# Patient Record
Sex: Female | Born: 1965 | Race: White | Hispanic: No | Marital: Married | Smoking: Current every day smoker
Health system: Southern US, Community
[De-identification: ages and names within clinical notes are randomized; demographics above are authoritative.]

## PROBLEM LIST (undated history)

## (undated) DIAGNOSIS — E785 Hyperlipidemia, unspecified: Secondary | ICD-10-CM

## (undated) DIAGNOSIS — S32020A Wedge compression fracture of second lumbar vertebra, initial encounter for closed fracture: Secondary | ICD-10-CM

## (undated) DIAGNOSIS — E559 Vitamin D deficiency, unspecified: Secondary | ICD-10-CM

## (undated) DIAGNOSIS — F17200 Nicotine dependence, unspecified, uncomplicated: Secondary | ICD-10-CM

## (undated) DIAGNOSIS — T7840XA Allergy, unspecified, initial encounter: Secondary | ICD-10-CM

## (undated) DIAGNOSIS — E786 Lipoprotein deficiency: Secondary | ICD-10-CM

## (undated) DIAGNOSIS — J302 Other seasonal allergic rhinitis: Secondary | ICD-10-CM

## (undated) HISTORY — DX: Vitamin D deficiency, unspecified: E55.9

## (undated) HISTORY — DX: Other seasonal allergic rhinitis: J30.2

## (undated) HISTORY — DX: Wedge compression fracture of second lumbar vertebra, initial encounter for closed fracture: S32.020A

## (undated) HISTORY — DX: Allergy, unspecified, initial encounter: T78.40XA

## (undated) HISTORY — DX: Lipoprotein deficiency: E78.6

## (undated) HISTORY — DX: Nicotine dependence, unspecified, uncomplicated: F17.200

## (undated) HISTORY — DX: Hyperlipidemia, unspecified: E78.5

---

## 1997-12-30 ENCOUNTER — Emergency Department (HOSPITAL_COMMUNITY): Admission: EM | Admit: 1997-12-30 | Discharge: 1997-12-30 | Payer: Self-pay | Admitting: Emergency Medicine

## 1998-09-06 ENCOUNTER — Inpatient Hospital Stay (HOSPITAL_COMMUNITY): Admission: AD | Admit: 1998-09-06 | Discharge: 1998-09-06 | Payer: Self-pay | Admitting: Obstetrics and Gynecology

## 1998-10-22 ENCOUNTER — Inpatient Hospital Stay (HOSPITAL_COMMUNITY): Admission: AD | Admit: 1998-10-22 | Discharge: 1998-10-24 | Payer: Self-pay | Admitting: Obstetrics & Gynecology

## 1998-11-24 ENCOUNTER — Other Ambulatory Visit: Admission: RE | Admit: 1998-11-24 | Discharge: 1998-11-24 | Payer: Self-pay | Admitting: Obstetrics and Gynecology

## 2000-02-24 ENCOUNTER — Emergency Department (HOSPITAL_COMMUNITY): Admission: EM | Admit: 2000-02-24 | Discharge: 2000-02-24 | Payer: Self-pay | Admitting: Emergency Medicine

## 2000-02-24 ENCOUNTER — Encounter: Payer: Self-pay | Admitting: Emergency Medicine

## 2001-12-31 ENCOUNTER — Encounter: Payer: Self-pay | Admitting: Emergency Medicine

## 2001-12-31 ENCOUNTER — Emergency Department (HOSPITAL_COMMUNITY): Admission: EM | Admit: 2001-12-31 | Discharge: 2001-12-31 | Payer: Self-pay | Admitting: Emergency Medicine

## 2007-06-14 LAB — HM PAP SMEAR: HM Pap smear: NORMAL

## 2010-05-03 ENCOUNTER — Ambulatory Visit: Payer: Self-pay | Admitting: Family Medicine

## 2010-05-21 ENCOUNTER — Ambulatory Visit: Payer: Self-pay | Admitting: Family Medicine

## 2010-12-20 ENCOUNTER — Ambulatory Visit (INDEPENDENT_AMBULATORY_CARE_PROVIDER_SITE_OTHER): Payer: 59 | Admitting: Family Medicine

## 2010-12-20 ENCOUNTER — Encounter: Payer: Self-pay | Admitting: Family Medicine

## 2010-12-20 VITALS — BP 120/88 | HR 88 | Temp 98.5°F | Ht 63.0 in | Wt 133.0 lb

## 2010-12-20 DIAGNOSIS — L0291 Cutaneous abscess, unspecified: Secondary | ICD-10-CM

## 2010-12-20 DIAGNOSIS — L0390 Cellulitis, unspecified: Secondary | ICD-10-CM

## 2010-12-20 DIAGNOSIS — E7800 Pure hypercholesterolemia, unspecified: Secondary | ICD-10-CM

## 2010-12-20 MED ORDER — CEPHALEXIN 500 MG PO CAPS: 500.0000 mg | ORAL_CAPSULE | Freq: Three times a day (TID) | ORAL | Status: AC

## 2010-12-20 NOTE — Patient Instructions (Addendum)
Please start cutting back on smoking--let us know if you need additional help (ie. Chantix).  Look into 1800QUITNOW or http://www.reed.com/. Follow low cholesterol diet.  I recommend omega 3 fish oil, between 3000-4000 mg daily.  Schedule fasting labs to repeat cholesterol.  If way above goal (goal LDL<130) then we can consider a different statin at a lower dose.  Abscess/Boil Care After (Furuncle) An abscess (also called a boil or furuncle) is an infected area that contains a collection of pus. Signs and symptoms of an abscess include pain, tenderness, redness, or hardness, or you may feel a moveable soft area under your skin. An abscess can occur anywhere in the body. The infection may spread to surrounding tissues causing cellulitis. A cut (incision) by the surgeon was made over your abscess and the pus was drained out. Gauze may have been packed into the space to provide a drain that will allow the cavity to heal from the inside outwards. The boil may be painful for 5 to 7 days. Most people with a boil do not have high fevers. Your abscess, if seen early, may not have localized, and may not have been lanced. If not, another appointment may be required for this if it does not get better on its own or with medications. HOME CARE INSTRUCTIONS  Only take over-the-counter or prescription medicines for pain, discomfort, or fever as directed by your caregiver.   When you bathe, soak and then remove gauze or iodoform packs at least daily or as directed by your caregiver. You may then wash the wound gently with mild soapy water. Repack with gauze or do as your caregiver directs.  SEEK IMMEDIATE MEDICAL CARE IF:  You develop increased pain, swelling, redness, drainage, or bleeding in the wound site.   You develop signs of generalized infection including muscle aches, chills, fever, or a general ill feeling.   An oral temperature above 101 develops, not controlled by medication.  See your caregiver for a  recheck if you develop any of the symptoms described above. If medications (antibiotics) were prescribed, take them as directed. Document Released: 12/16/2004 Document Re-Released: 11/17/2009 [facility] Patient Information 2011 [place], [place].

## 2010-12-20 NOTE — Progress Notes (Signed)
Patient presents today with complaint of cyst/abscess on R breast.  She has had the cyst for about 10 years.  Cyst became tender and enlarged 3 days ago.  It isn't draining anything.  Quadrupled in size last night, is very painful.  Denies fevers.  Denies nausea, vomiting  Patient was seen for a CPE in November, found to have high cholesterol.  She was put on Lipitor 20mg  and cholesterol came down a lot.  She didn't like the way she felt on it--felt "foggy", and felt much better after stopping it.  She has been eating more fish, and eats healthy.  She continues to smoke.  Past Medical History  Diagnosis Date  . Hypertension   . Tobacco use disorder     History reviewed. No pertinent past surgical history.  History   Social History  . Marital Status: Married    Spouse Name: N/A    Number of Children: 2  . Years of Education: N/A   Occupational History  . 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 Smoking status: Current Everyday Smoker -- 1.5 packs/day for 20 years  . Smokeless tobacco: Never Used  . Alcohol Use: Yes     1-2 drinks every other week  . Drug Use: No  . Sexually Active: Not on file   Other Topics Concern  . Not on file   Social History Narrative  . No narrative on file    Family History  Problem Relation Age of Onset  . Stroke Mother 37  . Hypertension Mother   . Heart disease Father     pacemaker  . Hyperlipidemia Brother   . Cancer Neg Hx     Current outpatient prescriptions:cephALEXin (KEFLEX) 500 MG capsule, Take 1 capsule (500 mg total) by mouth 3 (three) times daily., Disp: 30 capsule, Rfl: 0  Allergies  Allergen Reactions  . Erythromycin Rash   ROS:  No headaches, cough, SOB, wheezing (other than typical smoker's cough). No fevers, nausea, vomiting, other skin rashes or concerns  PHYSICAL EXAM: BP 120/88  Pulse 88  Temp 98.5 F (36.9 C)  Ht 5\' 3"  (1.6 m)  Wt 133 lb (60.328 kg)  BMI 23.56 kg/m2  LMP  12/06/2010 Well developed, pleasant female, in no acute distress R breast:  3.375 x 2.5 cm red, raised, fluctuant mass above R nipple, without overlying and surrounding erythema.  No streaking, no axillary lymphadenopathy. Verbal consent was obtained for incision and drainage.  Wound was anesthetized with 2% lidocaine with epi after being cleansed with betadine x 3 and subsequent removal with alcohol.  10 blade was used to incise skin and cyst, and copious amount of thin, watery yellow-green pus, along with thick white cheesy (sebaceous) material was removed.  1/4" iodoform "wick" was inserted into wound.  Patient tolerated procedure well.  Wound was dressed with bacitracin and bandage, and wound care instructions were reviewed  ASSESSMENT/PLAN: 1. Abscess and cellulitis  cephALEXin (KEFLEX) 500 MG capsule, PR Drain skin abscess, simple  2. Pure hypercholesterolemia      Re-check fasting lipids--not fasting today.  Will do at f/u (or order if not fasting at appt).  Reviewed low cholesterol diet.  Goal LDL<130.  HDL was low--recommended quitting smoking and take fish oil up to 08-3998 mg daily.    Patient was encouraged to quit smoking.  Discussed risks of smoking.  Instructed to start thinking about why/when patient smokes in order to come up with effective  strategies to cut back or quit. Discussed available resources, including free counseling through [place] [facility], smoking cessation classes through [facility], OTC nicotine replacements, and the possibility of assistance with prescription medication if patients own strategies fail and if patient is motivated to quit.

## 2010-12-22 ENCOUNTER — Encounter: Payer: Self-pay | Admitting: Family Medicine

## 2010-12-22 ENCOUNTER — Ambulatory Visit (INDEPENDENT_AMBULATORY_CARE_PROVIDER_SITE_OTHER): Payer: 59 | Admitting: Family Medicine

## 2010-12-22 VITALS — BP 128/76 | HR 72 | Ht 63.0 in | Wt 135.0 lb

## 2010-12-22 DIAGNOSIS — E7800 Pure hypercholesterolemia, unspecified: Secondary | ICD-10-CM

## 2010-12-22 DIAGNOSIS — N61 Mastitis without abscess: Secondary | ICD-10-CM

## 2010-12-22 DIAGNOSIS — N611 Abscess of the breast and nipple: Secondary | ICD-10-CM

## 2010-12-22 NOTE — Progress Notes (Signed)
Patient present to follow up on abscess of R breast that was I&D'd 2 days ago.  The wick came out last night.  Today noted significant drainage during her shower--a large amount of thick white material, followed by some thinner pus-like material (similar to what drained Monday).  Denies fevers.  Feels much better overall, some residual soreness.  Admits that she has never had a mammogram.  Didn't set up fasting labs (we discussed on Monday the need to re-check her cholesterol, since she is off medication).  Past Medical History  Diagnosis Date  . Hypertension   . Tobacco use disorder     No past surgical history on file.  History   Social History  . Marital Status: Married    Spouse Name: N/A    Number of Children: 2  . Years of Education: N/A   Occupational History  . 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 Smoking status: Current Everyday Smoker -- 1.5 packs/day for 20 years  . Smokeless tobacco: Never Used  . Alcohol Use: Yes     1-2 drinks every other week  . Drug Use: No  . Sexually Active: Not on file   Other Topics Concern  . Not on file   Social History Narrative  . No narrative on file    Family History  Problem Relation Age of Onset  . Stroke Mother 66  . Hypertension Mother   . Heart disease Father     pacemaker  . Hyperlipidemia Brother   . Cancer Neg Hx     Current outpatient prescriptions:cephALEXin (KEFLEX) 500 MG capsule, Take 1 capsule (500 mg total) by mouth 3 (three) times daily., Disp: 30 capsule, Rfl: 0  Allergies  Allergen Reactions  . Erythromycin Rash   ROS: no fevers, nausea/vomiting, rashes  PHYSICAL EXAM: BP 128/76  Pulse 72  Ht 5\' 3"  (1.6 m)  Wt 135 lb (61.236 kg)  BMI 23.91 kg/m2  LMP 12/06/2010 Well developed, pleasant female in no distress R breast--no longer has any raised abnormality.  Slight erythema.  Incision is still open, only slight serosanguinous drainage on bandage.  Still  has firm, indurated area, although no sebaceous or purulent material could be expressed from the open wound.  No axillary lymphadenopathy  ASSESSMENT/PLAN: 1. Abscess of breast    2. Pure hypercholesterolemia  Lipid panel   Complete course of Keflex. Schedule mammogram. Schedule fasting labs to f/u hyperlipidemia Quit smoking

## 2010-12-22 NOTE — Patient Instructions (Addendum)
Complete all the antibiotics. Please call to schedule your routine screening mammogram ([facility], both on [address]) Return for fasting cholesterol panel Quit smoking

## 2010-12-28 ENCOUNTER — Encounter: Payer: Self-pay | Admitting: Family Medicine

## 2010-12-29 ENCOUNTER — Other Ambulatory Visit: Payer: 59

## 2010-12-29 DIAGNOSIS — E7800 Pure hypercholesterolemia, unspecified: Secondary | ICD-10-CM

## 2010-12-29 LAB — LIPID PANEL
Cholesterol: 180 mg/dL (ref 0–200)
HDL: 39 mg/dL — ABNORMAL LOW (ref >39)
LDL Cholesterol: 128 mg/dL — ABNORMAL HIGH (ref 0–99)
Total CHOL/HDL Ratio: 4.6 Ratio
Triglycerides: 65 mg/dL (ref <150)
VLDL: 13 mg/dL (ref 0–40)

## 2010-12-30 ENCOUNTER — Telehealth: Payer: Self-pay | Admitting: *Deleted

## 2010-12-30 NOTE — Telephone Encounter (Signed)
Left message for patient to call office to schedule OV to go over labs per Dr.Knapp.

## 2011-01-10 ENCOUNTER — Ambulatory Visit (INDEPENDENT_AMBULATORY_CARE_PROVIDER_SITE_OTHER): Payer: 59 | Admitting: Family Medicine

## 2011-01-10 ENCOUNTER — Encounter: Payer: Self-pay | Admitting: Family Medicine

## 2011-01-10 VITALS — BP 110/68 | HR 64 | Ht 63.0 in | Wt 134.0 lb

## 2011-01-10 DIAGNOSIS — E782 Mixed hyperlipidemia: Secondary | ICD-10-CM

## 2011-01-10 NOTE — Patient Instructions (Signed)
Try and exercise at least 30 minutes daily. Low cholesterol diet--try and cut back on cheese intake QUIT SMOKING!! Continue fish oil (3000-4000mg ) daily  Re-check lipids in 6 months

## 2011-01-10 NOTE — Progress Notes (Signed)
Patient presents to review her recent lipids.  She was started on Lipitor in the past (with LDL 148 prior to meds), and LDL came down to 66.  She has been off of Lipitor, watching her diet closely. She admits that her downfall is cheese in her diet. She admits to getting less exercise over the last 9 months due to being  "busy".  Started taking fish oil about 3 weeks ago.  She continues to smoke.  Past Medical History  Diagnosis Date  . Hypertension   . Tobacco use disorder     No past surgical history on file.  History   Social History  . Marital Status: Married    Spouse Name: N/A    Number of Children: 2  . Years of Education: N/A   Occupational History  . 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 Smoking status: Current Everyday Smoker -- 1.5 packs/day for 20 years  . Smokeless tobacco: Never Used  . Alcohol Use: Yes     1-2 drinks every other week  . Drug Use: No  . Sexually Active: Not on file   Other Topics Concern  . Not on file   Social History Narrative  . No narrative on file    Family History  Problem Relation Age of Onset  . Stroke Mother 57  . Hypertension Mother   . Heart disease Father     pacemaker  . Hyperlipidemia Brother   . Cancer Neg Hx     Current outpatient prescriptions:fish oil-omega-3 fatty acids 1000 MG capsule, Take 4 g by mouth daily.  , Disp: , Rfl:   Allergies  Allergen Reactions  . Erythromycin Rash   Denies headaches, fevers, chest pain.  She reports that breast abscess/cyst has completely resolved.  PHYSICAL EXAM: BP 110/68  Pulse 64  Ht 5\' 3"  (1.6 m)  Wt 134 lb (60.782 kg)  BMI 23.74 kg/m2  LMP 12/27/2010 Exam limited to discussion, review of results  Lab results: Cholesterol 180; Triglycerides 65; HDL 39 (L); Total CHOL/HDL Ratio 4.6;  LDL Cholesterol 128   ASSESSMENT/PLAN: 1. Mixed hyperlipidemia  Lipid panel   LDL is at goal of <130, but HDL is low, ratio is high.  Discussed  importance of getting daily exercise, and quitting smoking to help raise the HDL.  Following a low cholesterol diet to lower LDL and total cholesterol (to improve ratio).  Will continue with behavioral/dietary measures rather than starting a medication at this point.  If needed in future, consider Niacin or low dose Crestor  Repeat lipids in 6 months; will send letter if normal,will  contact to schedule  f/u if needed

## 2011-05-26 ENCOUNTER — Ambulatory Visit (INDEPENDENT_AMBULATORY_CARE_PROVIDER_SITE_OTHER): Payer: 59 | Admitting: Family Medicine

## 2011-05-26 ENCOUNTER — Encounter: Payer: Self-pay | Admitting: Family Medicine

## 2011-05-26 VITALS — Ht 64.0 in | Wt 138.0 lb

## 2011-05-26 DIAGNOSIS — Z0000 Encounter for general adult medical examination without abnormal findings: Secondary | ICD-10-CM

## 2011-05-26 DIAGNOSIS — R5383 Other fatigue: Secondary | ICD-10-CM

## 2011-05-26 DIAGNOSIS — F17200 Nicotine dependence, unspecified, uncomplicated: Secondary | ICD-10-CM

## 2011-05-26 DIAGNOSIS — R5381 Other malaise: Secondary | ICD-10-CM

## 2011-05-26 LAB — POCT URINALYSIS DIPSTICK
Bilirubin, UA: NEGATIVE
Glucose, UA: NEGATIVE
Ketones, UA: NEGATIVE
Leukocytes, UA: NEGATIVE
Nitrite, UA: NEGATIVE
Protein, UA: NEGATIVE
Spec Grav, UA: <=1.005
Urobilinogen, UA: NEGATIVE
pH, UA: 7

## 2011-05-26 NOTE — Patient Instructions (Addendum)
HEALTH MAINTENANCE RECOMMENDATIONS:  It is recommended that you get at least 30 minutes of aerobic exercise at least 5 days/week (for weight loss, you may need as much as 60-90 minutes). This can be any activity that gets your heart rate up. This can be divided in 10-15 minute intervals if needed, but try and build up your endurance at least once a week.  Weight bearing exercise is also recommended twice weekly.  Eat a healthy diet with lots of vegetables, fruits and fiber.  "Colorful" foods have a lot of vitamins (ie green vegetables, tomatoes, red peppers, etc).  Limit sweet tea, regular sodas and alcoholic beverages, all of which has a lot of calories and sugar.  Up to 1 alcoholic drink daily may be beneficial for women (unless trying to lose weight, watch sugars).  Drink a lot of water.  Calcium recommendations are 1200-1500 mg daily (1500 mg for postmenopausal women or women without ovaries), and vitamin D 1000 IU daily.  This should be obtained from diet and/or supplements (vitamins), and calcium should not be taken all at once, but in divided doses.  Monthly self breast exams and yearly mammograms for women over the age of 39 is recommended.  Sunscreen of at least SPF 30 should be used on all sun-exposed parts of the skin when outside between the hours of 10 am and 4 pm (not just when at beach or pool, but even with exercise, golf, tennis, and yard work!)  Use a sunscreen that says "broad spectrum" so it covers both UVA and UVB rays, and make sure to reapply every 1-2 hours.  Remember to change the batteries in your smoke detectors when changing your clock times in the spring and fall.  Use your seat belt every time you are in a car, and please drive safely and not be distracted with cell phones and texting while driving.  Please schedule your mammogram; start exercising daily. Please quit smoking.  Consider Chantix (over summer if you're not ready to do earlier, consider trying e-cigarette  in the interim) Please try and increase calcium intake in your diet (vs supplements).  I recommend multivitamin which contains 1000 IU of Vitamin D.  If you can't tolerate MVI, then take a separate 1000 IU of Vitamin D daily (if your numbers are low when we check in Jan/Feb, you may be asked to take more)  Please try and get me a copy of your immunization records.  I would like for you to have a TdaP if you have never had one (ie if your last tetanus was a Td, and not a TdaP) I will want you to get a pneumonia vaccine if you continue to smoke.  And flu shots are encouraged

## 2011-05-26 NOTE — Progress Notes (Signed)
Sophia Sims is a 45 y.o. female who presents for a complete physical.  She has the following concerns: None.  Needs form filled out.  Continues to smoke   There is no immunization history on file for this patient. Believes she is up to date on all of her vaccines, as she got them updated before going back to school (before 2005).  Declines flu shots Last Pap smear: 2009 with GYN Last mammogram: never Last colonoscopy: never Last DEXA: never Dentist: every three months Ophtho: more than 5 years ago Exercise:  Inconsistent, maybe 2x/week CBC, lipids and TSH done 04/2010, lipids done again 12/2010 Doesn't eat yogurt or drink milk (other than in cereal), has some cheese.  Gets nauseated from calcium supplements  Past Medical History  Diagnosis Date  . Hypertension   . Tobacco use disorder     History reviewed. No pertinent past surgical history.  History   Social History  . Marital Status: Married    Spouse Name: N/A    Number of Children: 2  . Years of Education: N/A   Occupational History  . 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 Smoking status: Current Everyday Smoker -- 1.5 packs/day for 20 years  . Smokeless tobacco: Never Used  . Alcohol Use: Yes     1-2 drinks every other week  . Drug Use: No  . Sexually Active: Yes -- Female partner(s)    Birth Control/ Protection: Condom     and contraceptive film   Other Topics Concern  . Not on file   Social History Narrative  . No narrative on file    Family History  Problem Relation Age of Onset  . Stroke Mother 37  . Hypertension Mother   . Heart disease Father     pacemaker  . Hyperlipidemia Brother   . Cancer Neg Hx   . Diabetes Neg Hx    Current outpatient prescriptions:fish oil-omega-3 fatty acids 1000 MG capsule, Take 4 g by mouth daily.  , Disp: , Rfl:   Allergies  Allergen Reactions  . Erythromycin Rash   ROS:  The patient denies anorexia, fever, weight  changes, headaches,  vision changes, decreased hearing, ear pain, sore throat, breast concerns, chest pain, palpitations, dizziness, syncope, dyspnea on exertion, cough, swelling, nausea, vomiting, diarrhea, constipation, abdominal pain, melena, hematochezia, indigestion/heartburn, hematuria, incontinence, dysuria, +slightly irregular menstrual cycles, no vaginal discharge, odor or itch, genital lesions, joint pains, numbness, tingling, weakness, tremor, suspicious skin lesions, depression, anxiety, abnormal bleeding/bruising, or enlarged lymph nodes.  PHYSICAL EXAM: Ht 5\' 4"  (1.626 m)  Wt 138 lb (62.596 kg)  BMI 23.69 kg/m2  LMP 05/23/2011  General Appearance:    Alert, cooperative, no distress, appears stated age  Head:    Normocephalic, without obvious abnormality, atraumatic  Eyes:    PERRL, conjunctiva/corneas clear, EOM's intact, fundi    benign  Ears:    Normal TM's and external ear canals  Nose:   Nares normal, mucosa normal, no drainage or sinus   tenderness  Throat:   Lips, mucosa, and tongue normal; teeth and gums normal  Neck:   Supple, no lymphadenopathy;  thyroid:  no   enlargement/tenderness/nodules; no carotid   bruit or JVD  Back:    Spine nontender, no curvature, ROM normal, no CVA     tenderness  Lungs:     Clear to auscultation bilaterally without wheezes, rales or     ronchi; respirations unlabored  Chest  Wall:    No tenderness or deformity   Heart:    Regular rate and rhythm, S1 and S2 normal, no murmur, rub   or gallop  Breast Exam:    Deferred to GYN  Abdomen:     Soft, non-tender, nondistended, normoactive bowel sounds,    no masses, no hepatosplenomegaly  Genitalia:    Deferred to GYN     Extremities:   No clubbing, cyanosis or edema  Pulses:   2+ and symmetric all extremities  Skin:   Skin color, texture, turgor normal, no rashes or lesions  Lymph nodes:   Cervical, supraclavicular, and axillary nodes normal  Neurologic:   CNII-XII intact, normal strength,  sensation and gait; reflexes 2+ and symmetric throughout          Psych:   Normal mood, affect, hygiene and grooming.     ASSESSMENT/PLAN:  1. Routine general medical examination at a health care facility  POCT Urinalysis Dipstick, Visual acuity screening  2. Tobacco use disorder    3. Other malaise and fatigue  Vitamin D 25 hydroxy   She is past due for her GYN exam--she can either call and schedule with her GYN, or if she decides she doesn't want to go back there, she can return here for breast/pelvic exam.  Risk for osteoporosis--inadequate calcium intake and smoking, lack of weight-bearing exercise Quit smoking, daily exercise. Try and get calcium through diet or supplements. Lipids due in January/February.  Increasing exercise and quitting smoking will help raise the HDL  Discussed monthly self breast exams and yearly mammograms after the age of 75; at least 30 minutes of aerobic activity at least 5 days/week; proper sunscreen use reviewed; healthy diet, including goals of calcium and vitamin D intake and alcohol recommendations (less than or equal to 1 drink/day) reviewed; regular seatbelt use; changing batteries in smoke detectors.  Immunization recommendations discussed.  Colonoscopy recommendations reviewed  Declines flu shot.  Declines pneumovax--advised if she continues to smoke, it will be strongly encouraged She will try and get [place] her immunization records

## 2011-07-13 ENCOUNTER — Other Ambulatory Visit: Payer: 59

## 2011-07-19 ENCOUNTER — Other Ambulatory Visit: Payer: 59

## 2011-07-19 DIAGNOSIS — R5381 Other malaise: Secondary | ICD-10-CM

## 2011-07-19 DIAGNOSIS — E782 Mixed hyperlipidemia: Secondary | ICD-10-CM

## 2011-07-19 LAB — LIPID PANEL
Cholesterol: 197 mg/dL (ref 0–200)
Triglycerides: 114 mg/dL (ref <150)
VLDL: 23 mg/dL (ref 0–40)

## 2011-07-20 ENCOUNTER — Other Ambulatory Visit: Payer: Self-pay | Admitting: *Deleted

## 2011-07-20 DIAGNOSIS — E559 Vitamin D deficiency, unspecified: Secondary | ICD-10-CM

## 2011-07-20 DIAGNOSIS — E7800 Pure hypercholesterolemia, unspecified: Secondary | ICD-10-CM

## 2011-07-20 DIAGNOSIS — Z79899 Other long term (current) drug therapy: Secondary | ICD-10-CM

## 2011-07-20 MED ORDER — VITAMIN D (ERGOCALCIFEROL) 1.25 MG (50000 UNIT) PO CAPS: 50000.0000 [IU] | ORAL_CAPSULE | ORAL | Status: DC

## 2011-07-20 MED ORDER — ATORVASTATIN CALCIUM 10 MG PO TABS: 10.0000 mg | ORAL_TABLET | Freq: Every day | ORAL | Status: DC

## 2011-11-21 ENCOUNTER — Other Ambulatory Visit: Payer: 59

## 2011-11-21 DIAGNOSIS — E559 Vitamin D deficiency, unspecified: Secondary | ICD-10-CM

## 2011-11-21 DIAGNOSIS — Z79899 Other long term (current) drug therapy: Secondary | ICD-10-CM

## 2011-11-21 DIAGNOSIS — E7800 Pure hypercholesterolemia, unspecified: Secondary | ICD-10-CM

## 2011-11-21 LAB — HEPATIC FUNCTION PANEL
AST: 18 U/L (ref 0–37)
Bilirubin, Direct: 0.1 mg/dL (ref 0.0–0.3)
Total Bilirubin: 0.3 mg/dL (ref 0.3–1.2)

## 2011-11-21 LAB — LIPID PANEL: Total CHOL/HDL Ratio: 4.9 Ratio

## 2011-11-22 ENCOUNTER — Other Ambulatory Visit: Payer: Self-pay | Admitting: Family Medicine

## 2011-11-22 DIAGNOSIS — Z1231 Encounter for screening mammogram for malignant neoplasm of breast: Secondary | ICD-10-CM

## 2011-11-22 LAB — VITAMIN D 25 HYDROXY (VIT D DEFICIENCY, FRACTURES): Vit D, 25-Hydroxy: 38 ng/mL (ref 30–89)

## 2011-12-01 ENCOUNTER — Encounter: Payer: Self-pay | Admitting: Family Medicine

## 2011-12-01 ENCOUNTER — Other Ambulatory Visit (HOSPITAL_COMMUNITY)
Admission: RE | Admit: 2011-12-01 | Discharge: 2011-12-01 | Disposition: A | Discharge status: Home / Self Care | Payer: 59 | Source: Ambulatory Visit | Attending: Family Medicine | Admitting: Family Medicine

## 2011-12-01 ENCOUNTER — Ambulatory Visit (INDEPENDENT_AMBULATORY_CARE_PROVIDER_SITE_OTHER): Payer: 59 | Admitting: Family Medicine

## 2011-12-01 VITALS — BP 120/84 | HR 72 | Ht 64.0 in | Wt 119.0 lb

## 2011-12-01 DIAGNOSIS — R229 Localized swelling, mass and lump, unspecified: Secondary | ICD-10-CM

## 2011-12-01 DIAGNOSIS — F17200 Nicotine dependence, unspecified, uncomplicated: Secondary | ICD-10-CM

## 2011-12-01 DIAGNOSIS — Z01419 Encounter for gynecological examination (general) (routine) without abnormal findings: Secondary | ICD-10-CM | POA: Insufficient documentation

## 2011-12-01 MED ORDER — VARENICLINE TARTRATE 0.5 MG X 11 & 1 MG X 42 PO MISC: ORAL | Status: DC

## 2011-12-01 MED ORDER — VARENICLINE TARTRATE 0.5 MG X 11 & 1 MG X 42 PO MISC: ORAL | Status: AC

## 2011-12-01 MED ORDER — VARENICLINE TARTRATE 1 MG PO TABS: 1.0000 mg | ORAL_TABLET | Freq: Two times a day (BID) | ORAL | Status: AC

## 2011-12-01 NOTE — Patient Instructions (Signed)
Let the mammographer know about the area R breast We are referring you to GYN for excision of the nodule.  Good luck with quitting smoking.  Stop the medication if significant psychiatric side effects develop.  Set a quit date for 1-2 weeks after starting the medication, and continue for 3 months.

## 2011-12-01 NOTE — Progress Notes (Signed)
Chief Complaint  Patient presents with  . Gynecologic Exam    breast and pelvic exam and also would like to have cyst in pubic area.   HPI: Patient presents for Breast and pelvic exam.  She had the remainder of her CPE in December.  She noticed a cyst on R vaginal area for a couple of years.  No change in size, nontender.  H/o abscess on R breast and she fears this becoming that painful/infected, like many other cysts she has had.  She would like to have it removed.  Scheduled for mammogram tomorrow at [facility] Does breast exams and has no concerns, other than noticing some residual inflammation at R breast from previous abscess. Last pap 2009, no h/o abnormal paps.  Denies vaginal discharge, odor, itch  She reports she is finally ready to quit smoking.  Interested in starting Chantix.  Tried Wellbutrin in the past, as well as patches (irritate skin), Commit lozenges.  Past Medical History  Diagnosis Date  . Hypertension   . Tobacco use disorder    History   Social History  . Marital Status: Married    Spouse Name: N/A    Number of Children: 2  . Years of Education: N/A   Occupational History  . 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 Smoking status: Current Everyday Smoker -- 1.0 packs/day for 20 years  . Smokeless tobacco: Never Used  . Alcohol Use: Yes     1-2 drinks every other week  . Drug Use: No  . Sexually Active: Yes -- Female partner(s)    Birth Control/ Protection: Condom     and contraceptive film   Other Topics Concern  . Not on file   Social History Narrative   Married, 2 children (son in college, 41 yo daughter)   Current Outpatient Prescriptions on File Prior to Visit  Medication Sig Dispense Refill  . fish oil-omega-3 fatty acids 1000 MG capsule Take 4 g by mouth daily.        Marland Kitchen atorvastatin (LIPITOR) 10 MG tablet Take 1 tablet (10 mg total) by mouth daily.  30 tablet  3   Allergies  Allergen Reactions  .  Erythromycin Rash   ROS:  Denies fevers, cough or URI symptoms, chest pain, shortness of breath, GI complaints, other skin lesions/rashes.  PHYSICAL EXAM: BP 120/84  Pulse 72  Ht 5\' 4"  (1.626 m)  Wt 119 lb (53.978 kg)  BMI 20.43 kg/m2  LMP 11/17/2011 Well developed, pleasant female in no distress. Breast exam: no nipple inversion, discharge.  There are fibrocystic changes in both breasts, but much more prominent in the RUOQ.  WHSS right at nipple from previous abscess, with no significant residual mass in this area.  No axillary lymphadenopathy External genitalia: R perineum, 1 cm mobile, subcutaneous mass/nodule. No pore.  Appears very superficial, nontender, no fluctuance.  No other external lesions.  Cervix normal without lesions.  No cervical motion tenderness.  Uterus and adnexa are normal--no mass, nontender. Rectal exam: normal sphincter tone, no mass.  Heme negative stool Skin: normal without rash Psych: norma mood, affect, hygiene and grooming  ASSESSMENT/PLAN: 1. Routine gynecological examination  Cytology - PAP  2. Tobacco use disorder  varenicline (CHANTIX CONTINUING MONTH PAK) 1 MG tablet, varenicline (CHANTIX STARTING MONTH PAK) 0.5 MG X 11 & 1 MG X 42 tablet, DISCONTINUED: varenicline (CHANTIX STARTING MONTH PAK) 0.5 MG X 11 & 1 MG X 42 tablet, DISCONTINUED:  varenicline (CHANTIX STARTING MONTH PAK) 0.5 MG X 11 & 1 MG X 42 tablet  3. Nodule, subcutaneous  Ambulatory referral to Obstetrics / Gynecology   Discussed monthly self breast exams and yearly mammograms after the age of 59; at least 30 minutes of aerobic activity at least 5 days/week; proper sunscreen use reviewed; healthy diet, including goals of calcium and vitamin D intake and alcohol recommendations (less than or equal to 1 drink/day) reviewed; regular seatbelt use; changing batteries in smoke detectors.   Dicussed risks/side effects of Chantix.  To set a quit date.  Copay card given for Chantix.

## 2011-12-01 NOTE — Progress Notes (Deleted)
  Subjective:    Patient ID: Sophia Sims, female    DOB: [birth year], 46 y.o.   MRN: [id number]  HPI    Review of Systems     Objective:   Physical Exam        Assessment & Plan:

## 2011-12-02 ENCOUNTER — Ambulatory Visit
Admission: RE | Admit: 2011-12-02 | Discharge: 2011-12-02 | Disposition: A | Discharge status: Home / Self Care | Payer: 59 | Source: Ambulatory Visit | Attending: Family Medicine | Admitting: Family Medicine

## 2011-12-02 DIAGNOSIS — Z1231 Encounter for screening mammogram for malignant neoplasm of breast: Secondary | ICD-10-CM

## 2011-12-05 ENCOUNTER — Encounter: Payer: Self-pay | Admitting: Family Medicine

## 2011-12-06 ENCOUNTER — Other Ambulatory Visit: Payer: Self-pay | Admitting: Family Medicine

## 2011-12-06 DIAGNOSIS — N63 Unspecified lump in unspecified breast: Secondary | ICD-10-CM

## 2011-12-14 ENCOUNTER — Ambulatory Visit
Admission: RE | Admit: 2011-12-14 | Discharge: 2011-12-14 | Disposition: A | Discharge status: Home / Self Care | Payer: 59 | Source: Ambulatory Visit | Attending: Family Medicine | Admitting: Family Medicine

## 2011-12-14 DIAGNOSIS — N63 Unspecified lump in unspecified breast: Secondary | ICD-10-CM

## 2012-04-11 ENCOUNTER — Encounter: Payer: 59 | Admitting: Family Medicine

## 2012-04-26 ENCOUNTER — Other Ambulatory Visit: Payer: Self-pay | Admitting: Surgery

## 2012-09-11 DIAGNOSIS — S32020A Wedge compression fracture of second lumbar vertebra, initial encounter for closed fracture: Secondary | ICD-10-CM

## 2012-09-11 HISTORY — DX: Wedge compression fracture of second lumbar vertebra, initial encounter for closed fracture: S32.020A

## 2013-01-15 ENCOUNTER — Encounter: Payer: Self-pay | Admitting: Medical

## 2013-01-15 ENCOUNTER — Ambulatory Visit (INDEPENDENT_AMBULATORY_CARE_PROVIDER_SITE_OTHER): Payer: 59 | Admitting: Medical

## 2013-01-15 VITALS — BP 102/70 | HR 84 | Temp 98.1°F | Resp 16 | Ht 63.5 in | Wt 122.0 lb

## 2013-01-15 DIAGNOSIS — E786 Lipoprotein deficiency: Secondary | ICD-10-CM

## 2013-01-15 DIAGNOSIS — J302 Other seasonal allergic rhinitis: Secondary | ICD-10-CM

## 2013-01-15 DIAGNOSIS — F17200 Nicotine dependence, unspecified, uncomplicated: Secondary | ICD-10-CM

## 2013-01-15 DIAGNOSIS — J3489 Other specified disorders of nose and nasal sinuses: Secondary | ICD-10-CM

## 2013-01-15 DIAGNOSIS — E559 Vitamin D deficiency, unspecified: Secondary | ICD-10-CM

## 2013-01-15 DIAGNOSIS — Z0000 Encounter for general adult medical examination without abnormal findings: Secondary | ICD-10-CM

## 2013-01-15 DIAGNOSIS — J309 Allergic rhinitis, unspecified: Secondary | ICD-10-CM

## 2013-01-15 LAB — CBC WITH DIFFERENTIAL/PLATELET
Basophils Absolute: 0.1 10*3/uL (ref 0.0–0.1)
Basophils Relative: 1 % (ref 0–1)
HCT: 38.6 % (ref 36.0–46.0)
Lymphocytes Relative: 31 % (ref 12–46)
MCHC: 33.9 g/dL (ref 30.0–36.0)
Neutro Abs: 4.1 10*3/uL (ref 1.7–7.7)
Neutrophils Relative %: 59 % (ref 43–77)
Platelets: 273 10*3/uL (ref 150–400)
RDW: 15.9 % — ABNORMAL HIGH (ref 11.5–15.5)
WBC: 6.9 10*3/uL (ref 4.0–10.5)

## 2013-01-15 LAB — POCT URINALYSIS DIPSTICK
Bilirubin, UA: NEGATIVE
Blood, UA: NEGATIVE
Glucose, UA: NEGATIVE
Ketones, UA: NEGATIVE
Leukocytes, UA: NEGATIVE
Nitrite, UA: NEGATIVE
Protein, UA: NEGATIVE
Spec Grav, UA: <=1.005
Urobilinogen, UA: NEGATIVE
pH, UA: 7

## 2013-01-15 NOTE — Patient Instructions (Signed)

## 2013-01-15 NOTE — Progress Notes (Signed)
Subjective:   HPI  Sophia Sims is a 47 y.o. female who presents for a complete physical.  Medical care team includes:  Dermatology for yearly surveillance  Dentist, Dr. Marolyn Hammock  Dr. Lynelle Doctor here for primary care   Preventative care: Last ophthalmology visit: never Last dental visit:YES- DR. Marolyn Hammock Last colonoscopy:N/A Last mammogram:11/2011 Last gynecological exam:11/2011 Last EKG:04/2010 Last labs:11/2011  Prior vaccinations: TD or Tdap:2006 Influenza: declines Pneumococcal: never  Advanced directive:N/A Health care power of attorney:N/A Living will:N/A  Concerns: She notes compression fracture of L2 in April 2014.  Saw Dr. Simonne Come in f/u, apparently healed very quickly.  No concerns for Osteoporosis.   She fell down long slide during obstacle course race which she does regularly.  Reviewed their medical, surgical, family, social, medication, and allergy history and updated chart as appropriate.   Past Medical History  Diagnosis Date  . Tobacco use disorder     failed Wellbutrin, counseling, Chantix  . Seasonal allergic rhinitis   . History of mammogram 11/2011    diagnostic and ultrasound, normal  . Routine gynecological examination 11/2011    pap normal  . Compression fracture of L2 4/14    fall during obstacle race  . Vitamin D deficiency   . Low HDL (under 40)   . Hyperlipidemia     borderline, goal LDL 130 or less    History reviewed. No pertinent past surgical history.  Family History  Problem Relation Age of Onset  . Stroke Mother 40  . Hypertension Mother   . Heart disease Father     pacemaker  . Hyperlipidemia Brother   . Cancer Neg Hx   . Diabetes Neg Hx     History   Social History  . Marital Status: Married    Spouse Name: N/A    Number of Children: 2  . Years of Education: N/A   Occupational History  . 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 Smoking status: Current Every Day Smoker --  1.00 packs/day for 25 years  . Smokeless tobacco: Never Used  . Alcohol Use: 1.2 oz/week    1 Cans of beer, 1 Shots of liquor per week     Comment: 1-2 drinks every other week  . Drug Use: No  . Sexually Active: Yes -- Female partner(s)    Birth Control/ Protection: Condom     Comment: and contraceptive film   Other Topics Concern  . Not on file   Social History Narrative   separated, 2 children (son 63yo, 95 yo daughter), exercise - obstacle races, running, aerobics, free weights, no religious affiliation    Current Outpatient Prescriptions on File Prior to Visit  Medication Sig Dispense Refill  . fish oil-omega-3 fatty acids 1000 MG capsule Take 4 g by mouth daily.        . Multiple Vitamins-Minerals (MULTIVITAMIN WITH MINERALS) tablet Take 1 tablet by mouth daily.       No current facility-administered medications on file prior to visit.    Allergies  Allergen Reactions  . Erythromycin Rash     Review of Systems Constitutional: -fever, -chills, -sweats, -unexpected weight change, -decreased appetite, -fatigue Allergy: -sneezing, -itching, -congestion Dermatology: -changing moles, --rash, -lumps ENT: -runny nose, -ear pain, -sore throat, -hoarseness, +sinus pain, -teeth pain, - ringing in ears, -hearing loss, -nosebleeds Cardiology: -chest pain, -palpitations, -swelling, -difficulty breathing when lying flat, -waking up short of breath Respiratory: -cough, -shortness of breath, -difficulty breathing with exercise or  exertion, -wheezing, -coughing up blood Gastroenterology: -abdominal pain, -nausea, -vomiting, -diarrhea, -constipation, -blood in stool, -changes in bowel movement, -difficulty swallowing or eating Hematology: -bleeding, -bruising  Musculoskeletal: -joint aches, -muscle aches, -joint swelling, -back pain, -neck pain, -cramping, -changes in gait Ophthalmology: denies vision changes, eye redness, itching, discharge Urology: -burning with urination, -difficulty  urinating, -blood in urine, -urinary frequency, -urgency, -incontinence Neurology: -headache, -weakness, -tingling, -numbness, -memory loss, -falls, -dizziness Psychology: -depressed mood, -agitation, -sleep problems     Objective:   Physical Exam  Filed Vitals:   01/15/13 1025  BP: 102/70  Pulse: 84  Temp: 98.1 F (36.7 C)    General appearance: alert, no distress, WD/WN, lean white female Skin: several benign appearing macules and uniform brown small papular lesions of back, torso, no worrisome lesions HEENT: normocephalic, conjunctiva/corneas normal, sclerae anicteric, PERRLA, EOMi, nares patent, no discharge or erythema, pharynx normal Oral cavity: MMM, tongue normal, teeth in good repair Neck: supple, no lymphadenopathy, no thyromegaly, no masses, normal ROM Chest: non tender, normal shape and expansion Heart: RRR, normal S1, S2, no murmurs Lungs: CTA bilaterally, no wheezes, rhonchi, or rales Abdomen: +bs, soft, non tender, non distended, no masses, no hepatomegaly, no splenomegaly, no bruits Back: non tender, normal ROM, no scoliosis Musculoskeletal: upper extremities non tender, no obvious deformity, normal ROM throughout, lower extremities non tender, no obvious deformity, normal ROM throughout Extremities: no edema, no cyanosis, no clubbing Pulses: 2+ symmetric, upper and lower extremities, normal cap refill Neurological: alert, oriented x 3, CN2-12 intact, strength normal upper extremities and lower extremities, sensation normal throughout, DTRs 2+ throughout, no cerebellar signs, gait normal Psychiatric: normal affect, behavior normal, pleasant  Breast/gyn/rectal -deferred at patient request for Dr. Lynelle Doctor, her normal PCM   Assessment and Plan :    Encounter Diagnoses  Name Primary?  . Routine general medical examination at a health care facility Yes  . Unspecified vitamin D deficiency   . Tobacco use disorder   . Low HDL (under 40)     Physical exam - discussed  healthy lifestyle, diet, exercise, preventative care, vaccinations, and addressed their concerns.  Handout given.  Reviewed 2013 mammogram, pap, and she is up to date on screening.   Colonoscopy age 26yo.  Recommended pneumococcal vaccine, but she declines.  Tdap was around 2006 per her report.   Vit D deficiency - completed course of prescription Vit D last year.   Recheck level today  Tobacco use - failed numerous attempts.   Advised she retry again with counseling, discussed motivations to quit, consider hypnosis.    Low HDL - discussed her hx/o low HDL, diet, exercise, tobacco use, and possible medications including statin, Niacin.   She declines medication at this time.   She is very active with exercise and eating healthy.    Sinus pressure - begin Mucinex, nasal saline, hydrate well, and if not improving call or return  Allergic rhinitis - during seasonal periods, use Allegra.  If not improving, consider nasal steroid.  Will request records from Dr. Simonne Come from compression fracture 09/2012.   Follow-up pending labs.

## 2013-01-16 LAB — COMPREHENSIVE METABOLIC PANEL
ALT: 9 U/L (ref 0–35)
Albumin: 4.2 g/dL (ref 3.5–5.2)
CO2: 26 mEq/L (ref 19–32)
Calcium: 9.3 mg/dL (ref 8.4–10.5)
Chloride: 103 mEq/L (ref 96–112)
Creat: 0.59 mg/dL (ref 0.50–1.10)
Potassium: 4.2 mEq/L (ref 3.5–5.3)
Total Protein: 6.9 g/dL (ref 6.0–8.3)

## 2013-01-16 LAB — VITAMIN D 25 HYDROXY (VIT D DEFICIENCY, FRACTURES): Vit D, 25-Hydroxy: 37 ng/mL (ref 30–89)

## 2013-05-17 ENCOUNTER — Encounter: Payer: Self-pay | Admitting: Internal Medicine

## 2014-04-14 ENCOUNTER — Encounter: Payer: Self-pay | Admitting: Medical

## 2015-06-11 ENCOUNTER — Ambulatory Visit (INDEPENDENT_AMBULATORY_CARE_PROVIDER_SITE_OTHER): Payer: 59 | Admitting: Physician Assistant

## 2015-06-11 VITALS — BP 128/84 | HR 70 | Temp 98.5°F | Resp 16 | Ht 63.5 in | Wt 120.4 lb

## 2015-06-11 DIAGNOSIS — Z0000 Encounter for general adult medical examination without abnormal findings: Secondary | ICD-10-CM | POA: Diagnosis not present

## 2015-06-11 DIAGNOSIS — Z13228 Encounter for screening for other metabolic disorders: Secondary | ICD-10-CM | POA: Diagnosis not present

## 2015-06-11 DIAGNOSIS — Z1329 Encounter for screening for other suspected endocrine disorder: Secondary | ICD-10-CM | POA: Diagnosis not present

## 2015-06-11 DIAGNOSIS — E7800 Pure hypercholesterolemia, unspecified: Secondary | ICD-10-CM

## 2015-06-11 DIAGNOSIS — F17200 Nicotine dependence, unspecified, uncomplicated: Secondary | ICD-10-CM | POA: Diagnosis not present

## 2015-06-11 DIAGNOSIS — Z114 Encounter for screening for human immunodeficiency virus [HIV]: Secondary | ICD-10-CM

## 2015-06-11 DIAGNOSIS — Z130 Encounter for screening for diseases of the blood and blood-forming organs and certain disorders involving the immune mechanism: Secondary | ICD-10-CM

## 2015-06-11 LAB — CBC WITH DIFFERENTIAL/PLATELET
BASOS PCT: 1 % (ref 0–1)
Basophils Absolute: 0.1 10*3/uL (ref 0.0–0.1)
EOS ABS: 0.1 10*3/uL (ref 0.0–0.7)
Eosinophils Relative: 2 % (ref 0–5)
HCT: 34.5 % — ABNORMAL LOW (ref 36.0–46.0)
HEMOGLOBIN: 11 g/dL — AB (ref 12.0–15.0)
Lymphocytes Relative: 32 % (ref 12–46)
Lymphs Abs: 1.9 10*3/uL (ref 0.7–4.0)
MCH: 25.2 pg — AB (ref 26.0–34.0)
MCHC: 31.9 g/dL (ref 30.0–36.0)
MCV: 79.1 fL (ref 78.0–100.0)
MONO ABS: 0.4 10*3/uL (ref 0.1–1.0)
MONOS PCT: 7 % (ref 3–12)
MPV: 9.7 fL (ref 8.6–12.4)
NEUTROS ABS: 3.4 10*3/uL (ref 1.7–7.7)
Neutrophils Relative %: 58 % (ref 43–77)
PLATELETS: 295 10*3/uL (ref 150–400)
RBC: 4.36 MIL/uL (ref 3.87–5.11)
RDW: 18 % — AB (ref 11.5–15.5)
WBC: 5.8 10*3/uL (ref 4.0–10.5)

## 2015-06-11 LAB — COMPREHENSIVE METABOLIC PANEL
ALT: 8 U/L (ref 6–29)
AST: 14 U/L (ref 10–35)
Albumin: 4.3 g/dL (ref 3.6–5.1)
Alkaline Phosphatase: 43 U/L (ref 33–115)
BILIRUBIN TOTAL: 0.3 mg/dL (ref 0.2–1.2)
BUN: 5 mg/dL — AB (ref 7–25)
CHLORIDE: 103 mmol/L (ref 98–110)
CO2: 25 mmol/L (ref 20–31)
CREATININE: 0.55 mg/dL (ref 0.50–1.10)
Calcium: 9.1 mg/dL (ref 8.6–10.2)
GLUCOSE: 91 mg/dL (ref 65–99)
Potassium: 4.3 mmol/L (ref 3.5–5.3)
SODIUM: 137 mmol/L (ref 135–146)
Total Protein: 7.2 g/dL (ref 6.1–8.1)

## 2015-06-11 LAB — LIPID PANEL
CHOL/HDL RATIO: 4.7 ratio (ref <=5.0)
Cholesterol: 220 mg/dL — ABNORMAL HIGH (ref 125–200)
HDL: 47 mg/dL (ref >=46)
LDL CALC: 152 mg/dL — AB (ref <130)
Triglycerides: 104 mg/dL (ref <150)
VLDL: 21 mg/dL (ref <30)

## 2015-06-11 NOTE — Progress Notes (Signed)
Patient ID: Sophia Sims, female    DOB: Mar 03, 1966, 49 y.o.   MRN: [id number]  PCP: Wyatt Haste, MD  Chief Complaint  Patient presents with  . Annual Exam    no pap, also has paperwork in folder    Subjective:   HPI: Presents for annual physical exam in order to get the reduce insurance rate.  Declines breast cancer and cervical cancer screening today, preferring to reschedule for another day when she's not in as much of a hurry.  Wasn't able to get in with Rita Ohara. Has seen Dr. Harrington Challenger at [facility]. Last pap 11/2011 was normal. No HPV testing was done then. She is fasting today.   Patient Active Problem List   Diagnosis Date Noted  . Tobacco use disorder 05/26/2011  . Pure hypercholesterolemia 12/20/2010    Past Medical History  Diagnosis Date  . Tobacco use disorder     failed Wellbutrin, counseling, Chantix  . Seasonal allergic rhinitis   . History of mammogram 11/2011    diagnostic and ultrasound, normal  . Routine gynecological examination 11/2011    pap normal  . Compression fracture of L2 ([place]) 4/14    fall during obstacle race  . Vitamin D deficiency   . Low HDL (under 40)   . Hyperlipidemia     borderline, goal LDL 130 or less     Prior to Admission medications   Medication Sig Start Date End Date Taking? Authorizing Provider  fish oil-omega-3 fatty acids 1000 MG capsule Take 4 g by mouth daily. Reported on 06/11/2015    Historical Provider, MD  Multiple Vitamins-Minerals (MULTIVITAMIN WITH MINERALS) tablet Take 1 tablet by mouth daily. Reported on 06/11/2015    Historical Provider, MD  PREVIDENT 5000 BOOSTER PLUS 1.1 % PSTE USE ONCE DAILY IN PLACE OF REGULAR TOOTHPASTE 05/26/15   Historical Provider, MD    Allergies  Allergen Reactions  . Erythromycin Rash    History reviewed. No pertinent past surgical history.  Family History  Problem Relation Age of Onset  . Stroke Mother 17  . Hypertension Mother   . Heart disease Father       pacemaker  . Hyperlipidemia Brother   . Cancer Neg Hx   . Diabetes Neg Hx   . Depression Brother   . Mental illness Daughter     Social History   Social History  . Marital Status: Married    Spouse Name: separated from [place]  . Number of Children: 2  . Years of Education: master's   Occupational History  . 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 Smoking status: Current Every Day Smoker -- 1.00 packs/day for 25 years  . Smokeless tobacco: Never Used     Comment: enjoys smoking  . Alcohol Use: 1.2 oz/week    1 Cans of beer, 1 Shots of liquor per week     Comment: 1-2 drinks every other week  . Drug Use: No  . Sexual Activity:    Partners: Male    Birth Control/ Protection: Condom     Comment: and contraceptive film   Other Topics Concern  . 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   HENT: Negative.   Eyes: Negative.  Respiratory: Negative.   Cardiovascular: Negative.   Gastrointestinal: Negative.   Genitourinary: Negative.   Musculoskeletal: Negative.   Skin: Negative.   Neurological: Negative.   Psychiatric/Behavioral: Negative.         Objective:  Physical Exam  Constitutional: She is oriented to person, place, and time. Vital signs are normal. She appears well-developed and well-nourished. She is active and cooperative. No distress.  BP 128/84 mmHg  Pulse 70  Temp(Src) 98.5 F (36.9 C) (Oral)  Resp 16  Ht 5' 3.5" (1.613 m)  Wt 120 lb 6.4 oz (54.613 kg)  BMI 20.99 kg/m2  SpO2 97%  LMP 06/06/2015   HENT:  Head: Normocephalic and atraumatic.  Right Ear: Hearing, tympanic membrane, external ear and ear canal normal. No foreign bodies.  Left Ear: Hearing, tympanic membrane,  external ear and ear canal normal. No foreign bodies.  Nose: Nose normal.  Mouth/Throat: Uvula is midline, oropharynx is clear and moist and mucous membranes are normal. No oral lesions. Normal dentition. No dental abscesses or uvula swelling. No oropharyngeal exudate.  Eyes: Conjunctivae, EOM and lids are normal. Pupils are equal, round, and reactive to light. Right eye exhibits no discharge. Left eye exhibits no discharge. No scleral icterus.  Fundoscopic exam:      The right eye shows no arteriolar narrowing, no AV nicking, no exudate, no hemorrhage and no papilledema. The right eye shows red reflex.       The left eye shows no arteriolar narrowing, no AV nicking, no exudate, no hemorrhage and no papilledema. The left eye shows red reflex.  Neck: Trachea normal, normal range of motion and full passive range of motion without pain. Neck supple. No spinous process tenderness and no muscular tenderness present. No thyroid mass and no thyromegaly present.  Cardiovascular: Normal rate, regular rhythm, normal heart sounds, intact distal pulses and normal pulses.   Pulmonary/Chest: Effort normal and breath sounds normal.  Musculoskeletal: She exhibits no edema or tenderness.       Cervical back: Normal.       Thoracic back: Normal.       Lumbar back: Normal.  Lymphadenopathy:       Head (right side): No tonsillar, no preauricular, no posterior auricular and no occipital adenopathy present.       Head (left side): No tonsillar, no preauricular, no posterior auricular and no occipital adenopathy present.    She has no cervical adenopathy.       Right: No supraclavicular adenopathy present.       Left: No supraclavicular adenopathy present.  Neurological: She is alert and oriented to person, place, and time. She has normal strength and normal reflexes. No cranial nerve deficit. She exhibits normal muscle tone. Coordination and gait normal.  Skin: Skin is warm, dry and intact. No rash noted. She is not  diaphoretic. No cyanosis or erythema. Nails show no clubbing.  Psychiatric: She has a normal mood and affect. Her speech is normal and behavior is normal. Judgment and thought content normal.           Assessment & Plan:  1. Annual physical exam She will reschedule for breast exam and cervical cancer screening at her convenience.  2. Screening for HIV (human immunodeficiency virus) - HIV antibody  3. Pure hypercholesterolemia She is historically intolerant of statins. Had memory issues. Advised she resume OTC Fish oil to raise HDL. If TG or LDL increases, consider fenofibrate. - Lipid panel  4. Tobacco use disorder Smoking cessation encouraged, but she is  not presently interested.  5. Screening for metabolic disorder - Comprehensive metabolic panel  6. Screening for thyroid disorder - TSH  7. Screening for deficiency anemia - CBC with Differential/Platelet   Fara Chute, PA-C Physician Assistant-Certified [facility]

## 2015-06-11 NOTE — Patient Instructions (Signed)
I will contact you with your lab results as soon as they are available.   If you have not heard from me in 2 weeks, please contact me.  The fastest way to get your results is to register for My Chart (see the instructions on the last page of this printout).  Keeping You Healthy  Get These Tests 1. Blood Pressure- Have your blood pressure checked once a year by your health care provider.  Normal blood pressure is 120/80. 2. Weight- Have your body mass index (BMI) calculated to screen for obesity.  BMI is measure of body fat based on height and weight.  You can also calculate your own BMI at www.nhlbisupport.com/bmi/. 3. Cholesterol- Have your cholesterol checked every 5 years starting at age 20 then yearly starting at age 45. 4. Chlamydia, HIV, and other sexually transmitted diseases- Get screened every year until age 25, then within three months of each new sexual provider. 5. Pap Test - Every 1-5 years; discuss with your health care provider. 6. Mammogram- Every 1-2 years starting at age 40--50  Take these medicines  Calcium with Vitamin D-Your body needs 1200 mg of Calcium each day and 800-1000 IU of Vitamin D daily.  Your body can only absorb 500 mg of Calcium at a time so Calcium must be taken in 2 or 3 divided doses throughout the day.  Multivitamin with folic acid- Once daily if it is possible for you to become pregnant.  Get these Immunizations  Gardasil-Series of three doses; prevents HPV related illness such as genital warts and cervical cancer.  Menactra-Single dose; prevents meningitis.  Tetanus shot- Every 10 years.  Flu shot-Every year.  Take these steps 1. Do not smoke-Your healthcare provider can help you quit.  For tips on how to quit go to www.smokefree.gov or call 1-800 QUITNOW. 2. Be physically active- Exercise 5 days a week for at least 30 minutes.  If you are not already physically active, start slow and gradually work up to 30 minutes of moderate physical  activity.  Examples of moderate activity include walking briskly, dancing, swimming, bicycling, etc. 3. Breast Cancer- A self breast exam every month is important for early detection of breast cancer.  For more information and instruction on self breast exams, ask your healthcare provider or www.womenshealth.gov/faq/breast-self-exam.cfm. 4. Eat a healthy diet- Eat a variety of healthy foods such as fruits, vegetables, whole grains, low fat milk, low fat cheeses, yogurt, lean meats, poultry and fish, beans, nuts, tofu, etc.  For more information go to www. Thenutritionsource.org 5. Drink alcohol in moderation- Limit alcohol intake to one drink or less per day. Never drink and drive. 6. Depression- Your emotional health is as important as your physical health.  If you're feeling down or losing interest in things you normally enjoy please talk to your healthcare provider about being screened for depression. 7. Dental visit- Brush and floss your teeth twice daily; visit your dentist twice a year. 8. Eye doctor- Get an eye exam at least every 2 years. 9. Helmet use- Always wear a helmet when riding a bicycle, motorcycle, rollerblading or skateboarding. 10. Safe sex- If you may be exposed to sexually transmitted infections, use a condom. 11. Seat belts- Seat belts can save your live; always wear one. 12. Smoke/Carbon Monoxide detectors- These detectors need to be installed on the appropriate level of your home. Replace batteries at least once a year. 13. Skin cancer- When out in the sun please cover up and use sunscreen 15 SPF   or higher. 14. Violence- If anyone is threatening or hurting you, please tell your healthcare provider.        

## 2015-06-12 LAB — HIV ANTIBODY (ROUTINE TESTING W REFLEX): HIV 1&2 Ab, 4th Generation: NONREACTIVE

## 2015-06-12 LAB — TSH: TSH: 1.095 u[IU]/mL (ref 0.350–4.500)

## 2015-06-29 MED ORDER — NIACIN ER (ANTIHYPERLIPIDEMIC) 500 MG PO TBCR: 500.0000 mg | EXTENDED_RELEASE_TABLET | Freq: Every day | ORAL | Status: DC

## 2015-06-29 NOTE — Addendum Note (Signed)
Addended by: Constance Goltz on: 06/29/2015 07:07 PM   Modules accepted: Orders, SmartSet

## 2015-09-05 ENCOUNTER — Encounter (HOSPITAL_COMMUNITY): Payer: Self-pay | Admitting: Nurse Practitioner

## 2015-09-05 ENCOUNTER — Emergency Department (HOSPITAL_COMMUNITY)
Admission: EM | Admit: 2015-09-05 | Discharge: 2015-09-05 | Disposition: A | Discharge status: Home / Self Care | Payer: 59 | Attending: Emergency Medicine | Admitting: Emergency Medicine

## 2015-09-05 ENCOUNTER — Emergency Department (HOSPITAL_COMMUNITY): Payer: 59

## 2015-09-05 DIAGNOSIS — Y9389 Activity, other specified: Secondary | ICD-10-CM | POA: Diagnosis not present

## 2015-09-05 DIAGNOSIS — Z7982 Long term (current) use of aspirin: Secondary | ICD-10-CM | POA: Insufficient documentation

## 2015-09-05 DIAGNOSIS — Z79899 Other long term (current) drug therapy: Secondary | ICD-10-CM | POA: Diagnosis not present

## 2015-09-05 DIAGNOSIS — F17200 Nicotine dependence, unspecified, uncomplicated: Secondary | ICD-10-CM | POA: Diagnosis not present

## 2015-09-05 DIAGNOSIS — Y998 Other external cause status: Secondary | ICD-10-CM | POA: Diagnosis not present

## 2015-09-05 DIAGNOSIS — Z8659 Personal history of other mental and behavioral disorders: Secondary | ICD-10-CM | POA: Diagnosis not present

## 2015-09-05 DIAGNOSIS — S0993XA Unspecified injury of face, initial encounter: Secondary | ICD-10-CM | POA: Diagnosis present

## 2015-09-05 DIAGNOSIS — Z8781 Personal history of (healed) traumatic fracture: Secondary | ICD-10-CM | POA: Insufficient documentation

## 2015-09-05 DIAGNOSIS — Y9289 Other specified places as the place of occurrence of the external cause: Secondary | ICD-10-CM | POA: Diagnosis not present

## 2015-09-05 DIAGNOSIS — W541XXA Struck by dog, initial encounter: Secondary | ICD-10-CM | POA: Insufficient documentation

## 2015-09-05 DIAGNOSIS — R6884 Jaw pain: Secondary | ICD-10-CM

## 2015-09-05 MED ORDER — HYDROCODONE-ACETAMINOPHEN 5-325 MG PO TABS: 1.0000 | ORAL_TABLET | ORAL | Status: DC | PRN

## 2015-09-05 MED ORDER — OXYCODONE-ACETAMINOPHEN 5-325 MG PO TABS
2.0000 | ORAL_TABLET | Freq: Once | ORAL | Status: AC
  Administered 2015-09-05: 2 via ORAL
  Filled 2015-09-05: qty 2

## 2015-09-05 NOTE — ED Notes (Signed)
Pt is c/o right jaw pain, secondary to a traumatic event, states she "hit by her great dane dog while she was working in the yard." Obvious crepitus on mild palpation, pt only able to open her mouth slightily, no obvious signs of airway compromise, in severe pain.

## 2015-09-05 NOTE — ED Provider Notes (Signed)
CSN: [id number]     Arrival date & time 09/05/15  1719 History   First MD Initiated Contact with Patient 09/05/15 1831     Chief Complaint  Patient presents with  . Facial Injury  . Jaw Pain     (Consider location/radiation/quality/duration/timing/severity/associated sxs/prior Treatment) Patient is a 50 y.o. female presenting with facial injury. The history is provided by the patient and medical records.  Facial Injury   50 year old female with history of allergic rhinitis, hyperlipidemia, vitamin D deficiency, presenting to the ED for right jaw pain. Patient was working in the yard and then down to pick up some sticks when her great dane decided to start chasing a bird in the yard and hit her on the right side of her back.  This made her jaw clench together and chipped 2 of her teeth.  No LOC.  Dog is approx 9 months old, 115 pounds.  Patient reports significant pain in her right upper jaw, difficulty opening her mouth fully.  She is able to drink fluids.  She took some ASA prior to arrival without significant relief.    Past Medical History  Diagnosis Date  . Tobacco use disorder     failed Wellbutrin, counseling, Chantix  . Seasonal allergic rhinitis   . History of mammogram 11/2011    diagnostic and ultrasound, normal  . Routine gynecological examination 11/2011    pap normal  . Compression fracture of L2 ([place]) 4/14    fall during obstacle race  . Vitamin D deficiency   . Low HDL (under 40)   . Hyperlipidemia     borderline, goal LDL 130 or less   History reviewed. No pertinent past surgical history. Family History  Problem Relation Age of Onset  . Stroke Mother 31  . Hypertension Mother   . Heart disease Father     pacemaker  . Hyperlipidemia Brother   . Cancer Neg Hx   . Diabetes Neg Hx   . Depression Brother   . Mental illness Daughter    Social History  Substance Use Topics  . Smoking status: Current Every Day Smoker -- 1.00 packs/day for 25 years  . Smokeless  tobacco: Never Used     Comment: enjoys smoking  . Alcohol Use: 1.2 oz/week    1 Cans of beer, 1 Shots of liquor per week     Comment: 1-2 drinks every other week   OB History    Gravida Para Term Preterm AB TAB SAB Ectopic Multiple Living   2 2 2       2      Review of Systems  HENT: Positive for dental problem.   All other systems reviewed and are negative.     Allergies  Statins and Erythromycin  Home Medications   Prior to Admission medications   Medication Sig Start Date End Date Taking? Authorizing Provider  aspirin 325 MG tablet Take 650 mg by mouth once.   Yes Historical Provider, MD  fish oil-omega-3 fatty acids 1000 MG capsule Take 4 g by mouth daily. Reported on 06/11/2015   Yes Historical Provider, MD  PREVIDENT 5000 BOOSTER PLUS 1.1 % PSTE USE ONCE DAILY IN PLACE OF REGULAR TOOTHPASTE 05/26/15  Yes Historical Provider, MD  niacin (NIASPAN) 500 MG CR tablet Take 1 tablet (500 mg total) by mouth daily. Patient not taking: Reported on 09/05/2015 06/29/15   Shawnee Knapp, MD   BP 140/108 mmHg  Pulse 90  Temp(Src) 98.6 F (37 C) (Oral)  Resp  20  SpO2 97%   Physical Exam  Constitutional: She is oriented to person, place, and time. She appears well-developed and well-nourished. No distress.  HENT:  Head: Normocephalic and atraumatic.  Mouth/Throat: Uvula is midline, oropharynx is clear and moist and mucous membranes are normal. No oropharyngeal exudate, posterior oropharyngeal edema, posterior oropharyngeal erythema or tonsillar abscesses.  Right jaw TTP along upper aspect near TMJ; no gross deformity noted; only able to open mouth a few inches due to pain, no malocclusion noted; defects noted to right upper and lower central incisors; mid-face stable; no tongue laceration  Eyes: Conjunctivae and EOM are normal. Pupils are equal, round, and reactive to light.  Neck: Normal range of motion. Neck supple.  Cardiovascular: Normal rate, regular rhythm and normal heart sounds.    Pulmonary/Chest: Effort normal and breath sounds normal. No respiratory distress. She has no wheezes.  Abdominal: Soft. Bowel sounds are normal. There is no tenderness. There is no guarding.  Musculoskeletal: Normal range of motion. She exhibits no edema.  Neurological: She is alert and oriented to person, place, and time.  AAOx3, answering questions appropriately; equal strength UE and LE bilaterally; CN grossly intact; moves all extremities appropriately without ataxia; no focal neuro deficits or facial asymmetry appreciated  Skin: Skin is warm and dry. She is not diaphoretic.  Psychiatric: She has a normal mood and affect.  Nursing note and vitals reviewed.   ED Course  Procedures (including critical care time) Labs Review Labs Reviewed - No data to display  Imaging Review Ct Maxillofacial Wo Cm  09/05/2015  CLINICAL DATA:  Right jaw pain and T for loose after trauma. Limited ability to open mouth. EXAM: CT MAXILLOFACIAL WITHOUT CONTRAST TECHNIQUE: Multidetector CT imaging of the maxillofacial structures was performed. Multiplanar CT image reconstructions were also generated. A small metallic BB was placed on the right temple in order to reliably differentiate right from left. COMPARISON:  None. FINDINGS: The globes and extraocular muscles appear intact and symmetrical. Minimal mucosal thickening in the roof of the right maxillary antrum. The paranasal sinuses are otherwise clear. The orbital rims, maxillary antral walls, nasal bones, nasal septum, pterygoid plates, and zygomatic arches are intact. Temporomandibular joints are nondisplaced. There is evidence of degenerative flattening of the mandibular head on the left. The mandibles appear intact. Evaluation of the teeth is limited due to artifact from dental hardware. There appear to been some previous tooth extractions. In the right maxillary teeth, there is evidence of periapical lucency, possibly indicating periodontal disease. Soft  tissues are unremarkable. IMPRESSION: No acute orbital or facial fractures are identified. Mandibles appear intact. Suggestion of periodontal disease involving some of the maxillary teeth. Degenerative changes in the left temporomandibular joint. Electronically Signed   By: Lucienne Capers M.D.   On: 09/05/2015 19:28   I have personally reviewed and evaluated these images and lab results as part of my medical decision-making.   EKG Interpretation None      MDM   Final diagnoses:  Jaw pain   50 year old female here with right jaw pain after being hit in the face by her great Dane.  Right jaw is tender near right TMJ, no gross deformity noted on exam.  Able to open mouth slightly but not fully due to pain.  She does have defects noted to her right upper and lower central incisors, no other dental injuries noted.  Midface is stable, no other tenderness noted. CT of the face was obtained which is negative for acute jaw fracture.  Patient remains awake, alert, fully oriented. No evidence of concussion at this time. Do not suspect acute intracranial pathology. Patient will be discharged home to follow with her dentist regarding dental injuries. Rx Vicodin.  Discussed plan with patient, he/she acknowledged understanding and agreed with plan of care.  Return precautions given for new or worsening symptoms.  Larene Pickett, PA-C 09/05/15 2223  Lacretia Leigh, MD 09/05/15 [phone]

## 2015-09-05 NOTE — Discharge Instructions (Signed)
Take the prescribed medication as directed.  Use caution, medication can make you drowsy/sleepy. Follow-up with your dentist regarding chipped teeth and if you continue to have jaw pain. Return to the ED for new or worsening symptoms.

## 2016-05-31 ENCOUNTER — Encounter: Payer: Self-pay | Admitting: Physician Assistant

## 2016-05-31 ENCOUNTER — Ambulatory Visit (INDEPENDENT_AMBULATORY_CARE_PROVIDER_SITE_OTHER): Payer: 59 | Admitting: Physician Assistant

## 2016-05-31 VITALS — BP 106/74 | HR 85 | Temp 98.3°F | Resp 16 | Ht 63.75 in | Wt 119.8 lb

## 2016-05-31 DIAGNOSIS — D649 Anemia, unspecified: Secondary | ICD-10-CM

## 2016-05-31 DIAGNOSIS — Z13228 Encounter for screening for other metabolic disorders: Secondary | ICD-10-CM | POA: Diagnosis not present

## 2016-05-31 DIAGNOSIS — Z23 Encounter for immunization: Secondary | ICD-10-CM

## 2016-05-31 DIAGNOSIS — F17200 Nicotine dependence, unspecified, uncomplicated: Secondary | ICD-10-CM | POA: Diagnosis not present

## 2016-05-31 DIAGNOSIS — E7800 Pure hypercholesterolemia, unspecified: Secondary | ICD-10-CM | POA: Diagnosis not present

## 2016-05-31 DIAGNOSIS — N859 Noninflammatory disorder of uterus, unspecified: Secondary | ICD-10-CM

## 2016-05-31 DIAGNOSIS — N898 Other specified noninflammatory disorders of vagina: Secondary | ICD-10-CM

## 2016-05-31 DIAGNOSIS — Z0000 Encounter for general adult medical examination without abnormal findings: Secondary | ICD-10-CM

## 2016-05-31 DIAGNOSIS — N858 Other specified noninflammatory disorders of uterus: Secondary | ICD-10-CM

## 2016-05-31 DIAGNOSIS — Z01419 Encounter for gynecological examination (general) (routine) without abnormal findings: Secondary | ICD-10-CM | POA: Diagnosis not present

## 2016-05-31 NOTE — Patient Instructions (Signed)
     IF you received an x-ray today, you will receive an invoice from [facility]. Please contact  Radiology at [phone] with questions or concerns regarding your invoice.   IF you received labwork today, you will receive an invoice from [facility]. Please contact [facility] at [phone] with questions or concerns regarding your invoice.   Our billing staff will not be able to assist you with questions regarding bills from these companies.  You will be contacted with the lab results as soon as they are available. The fastest way to get your results is to activate your My Chart account. Instructions are located on the last page of this paperwork. If you have not heard from us regarding the results in 2 weeks, please contact this office.     

## 2016-05-31 NOTE — Progress Notes (Signed)
Sophia Sims  MRN: [id number] DOB: 29-Jan-1966  Subjective:  Pt presents to clinic for a CPE. Doing well. No problems.  Last dental exam: not recently due to fracture jaw - plans to schedule when she is able to open her mouth Last vision exam: wear reading glasses Last pap smear: unsure Last mammogram: 2013 Vaccinations      Tetanus - needs  Exercise: not in the last year after her jaw was broken Diet: yes, mostly water or black coffee  Patient Active Problem List   Diagnosis Date Noted  . Tobacco use disorder 05/26/2011  . Pure hypercholesterolemia 12/20/2010    Current Outpatient Prescriptions on File Prior to Visit  Medication Sig Dispense Refill  . aspirin 325 MG tablet Take 650 mg by mouth once.     No current facility-administered medications on file prior to visit.     Allergies  Allergen Reactions  . Lipitor [Atorvastatin] Other (See Comments)    fogginess  . Erythromycin Rash    Social History   Social History  . Marital status: Married    Spouse name: separated from [place]  . Number of children: 2  . Years of education: master's   Occupational History  . teaches biology and physics at [employer]   Social History Main Topics  . Smoking status: Current Every Day Smoker    Packs/day: 1.50    Years: 25.00  . Smokeless tobacco: Never Used     Comment: enjoys smoking  . Alcohol use 1.2 oz/week    1 Cans of beer, 1 Shots of liquor per week     Comment: 1-2 drinks every other week  . Drug use: No  . Sexual activity: Not Currently    Partners: Male    Birth control/ protection: Condom     Comment: and contraceptive film   Other Topics Concern  . 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 No pertinent surgical history.  Family  History  Problem Relation Age of Onset  . Stroke Mother 88  . Hypertension Mother   . Hyperlipidemia Mother   . Heart disease Father     pacemaker  . Hyperlipidemia Brother   . Depression Brother   . Mental illness Brother   . Mental illness Daughter   . Cancer Neg Hx   . Diabetes Neg Hx     Review of Systems  Constitutional: Negative.   HENT: Positive for ear pain and rhinorrhea.   Eyes: Negative.   Respiratory: Negative.   Cardiovascular: Negative.   Gastrointestinal: Negative.   Endocrine: Negative.   Genitourinary: Negative.   Musculoskeletal: Negative.   Skin: Negative.   Allergic/Immunologic: Positive for environmental allergies.  Neurological: Positive for headaches (from jaw pain).  Hematological: Negative.   Psychiatric/Behavioral: Negative.     Objective:  BP 106/74 (BP Location: Left Arm, Patient Position: Sitting, Cuff Size: Normal)   Pulse 85   Temp 98.3 F (36.8 C) (Oral)   Resp 16   Ht 5' 3.75" (1.619 m)   Wt 119 lb 12.8 oz (54.3 kg)   LMP 05/09/2016   SpO2 96%   BMI 20.73 kg/m   Physical Exam  Constitutional: She is oriented to person, place, and time and well-developed, well-nourished, and in no distress.  HENT:  Head: Normocephalic and atraumatic.  Right Ear: Hearing, tympanic membrane, external ear and ear canal normal.  Left Ear: Hearing, tympanic membrane, external ear and ear canal normal.  Nose: Nose normal.  Mouth/Throat: Uvula is midline, oropharynx is clear and moist and mucous membranes are normal.  Eyes: Conjunctivae and EOM are normal. Pupils are equal, round, and reactive to light.  Neck: Trachea normal and normal range of motion. Neck supple. No thyroid mass and no thyromegaly present.  Cardiovascular: Normal rate, regular rhythm and normal heart sounds.   No murmur heard. Pulmonary/Chest: Effort normal and breath sounds normal. She has no wheezes. Right breast exhibits mass. Right breast exhibits no inverted nipple, no nipple  discharge, no skin change and no tenderness. Left breast exhibits no inverted nipple, no mass, no nipple discharge, no skin change and no tenderness. Breasts are asymmetrical.    Abdominal: Soft. Bowel sounds are normal. There is no tenderness.  Genitourinary: Right adnexa normal, left adnexa normal and vulva normal. Uterus is enlarged (4cm - nontender - firm). Thick  white and vaginal discharge (on vaginal wall) found.  Musculoskeletal: Normal range of motion.  Lymphadenopathy:    She has no cervical adenopathy.  Neurological: She is alert and oriented to person, place, and time. She has normal motor skills, normal sensation, normal strength and normal reflexes. Gait normal.  Skin: Skin is warm and dry.  Psychiatric: Mood, memory, affect and judgment normal.    Visual Acuity Screening   Right eye Left eye Both eyes  Without correction: '20/25 20/25 20/25 '  With correction:       Assessment and Plan :  Annual physical exam  Encounter for gynecological examination without abnormal finding - Plan: Pap IG and HPV (high risk) DNA detection  Tobacco use disorder - suggested to decrease/cessation  Pure hypercholesterolemia - Plan: Lipid panel  Screening for metabolic disorder - Plan: CMP14+EGFR  Anemia, unspecified type - Plan: CBC with Differential/Platelet - anemic last year - we will recheck - may be related to heavy menses  Need for Tdap vaccination - Plan: Tdap vaccine greater than or equal to 7yo IM  Uterine mass - Plan: US Pelvis Complete - schedule - likely fibroids but we want to make sure - that would explain her heavy painful menses  Pt to schedule mammogram at [facility] imaging.  Windell Hummingbird PA-C  [facility] 05/31/2016 3:26 PM

## 2016-06-01 ENCOUNTER — Telehealth: Payer: Self-pay

## 2016-06-01 DIAGNOSIS — N858 Other specified noninflammatory disorders of uterus: Secondary | ICD-10-CM

## 2016-06-01 LAB — LIPID PANEL
CHOLESTEROL TOTAL: 227 mg/dL — AB (ref 100–199)
Chol/HDL Ratio: 4 ratio units (ref 0.0–4.4)
HDL: 57 mg/dL (ref >39)
LDL CALC: 154 mg/dL — AB (ref 0–99)
TRIGLYCERIDES: 81 mg/dL (ref 0–149)
VLDL CHOLESTEROL CAL: 16 mg/dL (ref 5–40)

## 2016-06-01 LAB — CMP14+EGFR
A/G RATIO: 1.5 (ref 1.2–2.2)
ALK PHOS: 54 IU/L (ref 39–117)
ALT: 10 IU/L (ref 0–32)
AST: 14 IU/L (ref 0–40)
Albumin: 4.3 g/dL (ref 3.5–5.5)
BUN/Creatinine Ratio: 9 (ref 9–23)
BUN: 5 mg/dL — ABNORMAL LOW (ref 6–24)
Bilirubin Total: 0.2 mg/dL (ref 0.0–1.2)
CHLORIDE: 98 mmol/L (ref 96–106)
CO2: 24 mmol/L (ref 18–29)
Calcium: 9.1 mg/dL (ref 8.7–10.2)
Creatinine, Ser: 0.55 mg/dL — ABNORMAL LOW (ref 0.57–1.00)
GFR calc Af Amer: 128 mL/min/{1.73_m2} (ref >59)
GFR calc non Af Amer: 111 mL/min/{1.73_m2} (ref >59)
GLOBULIN, TOTAL: 2.8 g/dL (ref 1.5–4.5)
Glucose: 87 mg/dL (ref 65–99)
POTASSIUM: 4.3 mmol/L (ref 3.5–5.2)
SODIUM: 139 mmol/L (ref 134–144)
Total Protein: 7.1 g/dL (ref 6.0–8.5)

## 2016-06-01 LAB — CBC WITH DIFFERENTIAL/PLATELET
Basophils Absolute: 0 10*3/uL (ref 0.0–0.2)
Basos: 1 %
EOS (ABSOLUTE): 0.1 10*3/uL (ref 0.0–0.4)
Eos: 2 %
Hematocrit: 42.3 % (ref 34.0–46.6)
Hemoglobin: 13.6 g/dL (ref 11.1–15.9)
IMMATURE GRANULOCYTES: 0 %
Immature Grans (Abs): 0 10*3/uL (ref 0.0–0.1)
Lymphocytes Absolute: 1.8 10*3/uL (ref 0.7–3.1)
Lymphs: 28 %
MCH: 29.6 pg (ref 26.6–33.0)
MCHC: 32.2 g/dL (ref 31.5–35.7)
MCV: 92 fL (ref 79–97)
MONOS ABS: 0.4 10*3/uL (ref 0.1–0.9)
Monocytes: 6 %
NEUTROS PCT: 63 %
Neutrophils Absolute: 4.1 10*3/uL (ref 1.4–7.0)
PLATELETS: 282 10*3/uL (ref 150–379)
RBC: 4.6 x10E6/uL (ref 3.77–5.28)
RDW: 16.6 % — ABNORMAL HIGH (ref 12.3–15.4)
WBC: 6.5 10*3/uL (ref 3.4–10.8)

## 2016-06-01 NOTE — Telephone Encounter (Signed)
THIS MESSAGE IS TO SARAH FROM [facility]ATH[facility] AT [facility]. SARAH PUT INTO EPIC FOR THIS PATIENT TO HAVE AN ULTRASOUND PELVIC COMPLETE. SHE NEEDS SARAH TO ALSO ADD ULTRASOUND TRANSVAGINAL NON OB. BEST PHONE IF QUESTIONS: [phone] (PLEASE ASK FOR CATHY)  MBC

## 2016-06-01 NOTE — Telephone Encounter (Signed)
Please add order

## 2016-06-04 LAB — PAP IG AND HPV HIGH-RISK
HPV, HIGH-RISK: NEGATIVE
PAP Smear Comment: 0

## 2016-06-04 LAB — VAGINITIS/VAGINOSIS, DNA PROBE
CANDIDA SPECIES: NEGATIVE
GARDNERELLA VAGINALIS: NEGATIVE
Trichomonas vaginosis: NEGATIVE

## 2016-06-04 LAB — WET PREP FOR TRICH, YEAST, CLUE

## 2016-06-04 LAB — TEST CODE CHANGE

## 2016-06-08 ENCOUNTER — Encounter: Payer: Self-pay | Admitting: *Deleted

## 2016-07-01 ENCOUNTER — Ambulatory Visit
Admission: RE | Admit: 2016-07-01 | Discharge: 2016-07-01 | Disposition: A | Discharge status: Home / Self Care | Payer: 59 | Source: Ambulatory Visit | Attending: Physician Assistant | Admitting: Physician Assistant

## 2016-07-01 DIAGNOSIS — N858 Other specified noninflammatory disorders of uterus: Secondary | ICD-10-CM

## 2016-07-07 ENCOUNTER — Encounter: Payer: Self-pay | Admitting: Physician Assistant

## 2016-07-07 DIAGNOSIS — D259 Leiomyoma of uterus, unspecified: Secondary | ICD-10-CM | POA: Insufficient documentation

## 2016-12-14 IMAGING — CT CT MAXILLOFACIAL W/O CM
3 of 4 series · 15 of 47 positions shown, 18 images · non-contrast
Comparison: None.

CLINICAL DATA: Right jaw pain and T for loose after trauma. Limited
ability to open mouth.

EXAM:
CT MAXILLOFACIAL WITHOUT CONTRAST
TECHNIQUE: Multidetector CT imaging of the maxillofacial structures was
performed. Multiplanar CT image reconstructions were also generated.
A small metallic BB was placed on the right temple in order to
reliably differentiate right from left.

[Series 3: facial st · axial · 0.32mm/px · z∈[-501,-359]mm · 9 of 83 slices shown, 12 images]
[im 6/83  brain]
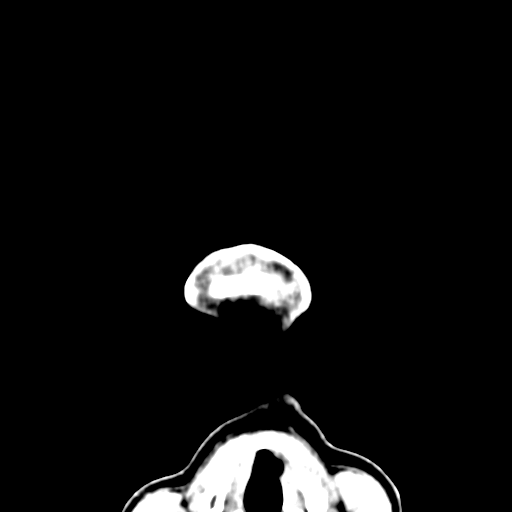
[im 6/83  bone]
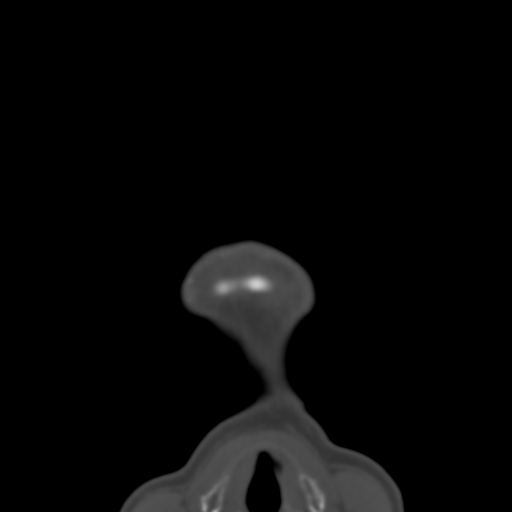
[im 15/83  bone]
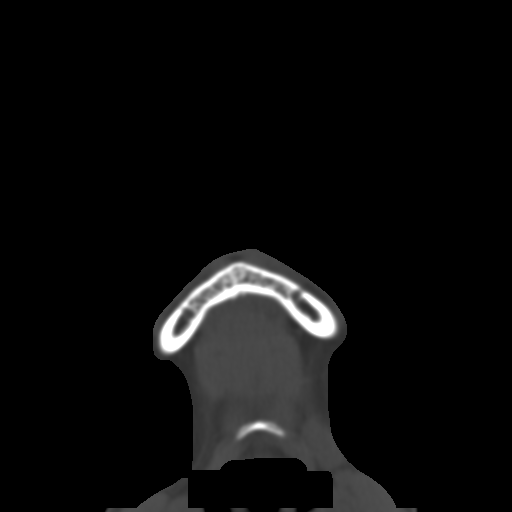
[im 23/83  bone]
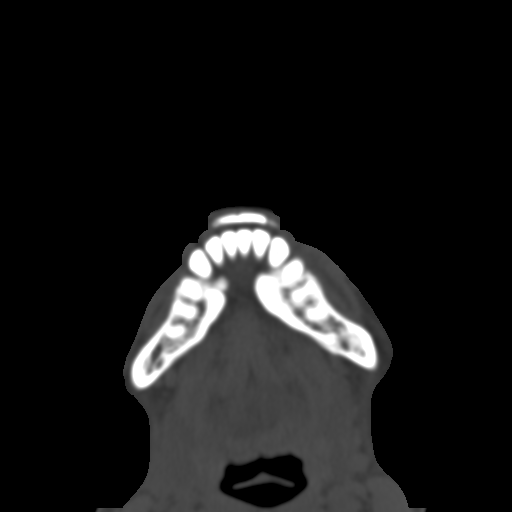
[im 32/83  bone]
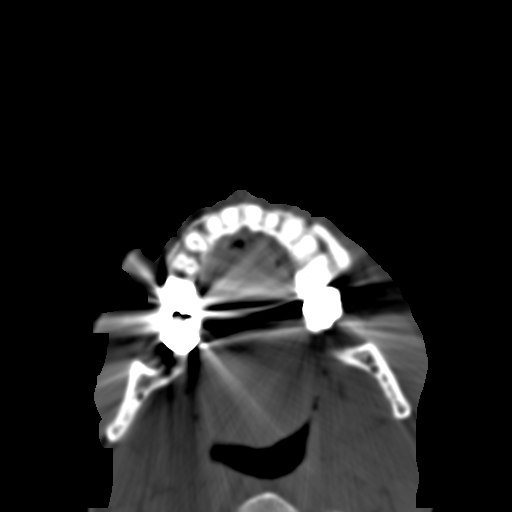
[im 43/83  brain]
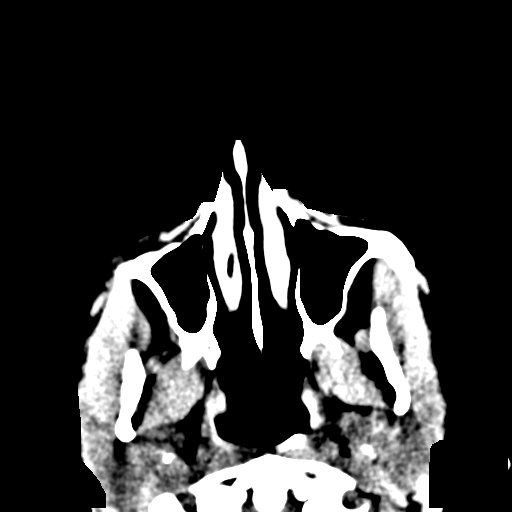
[im 43/83  bone]
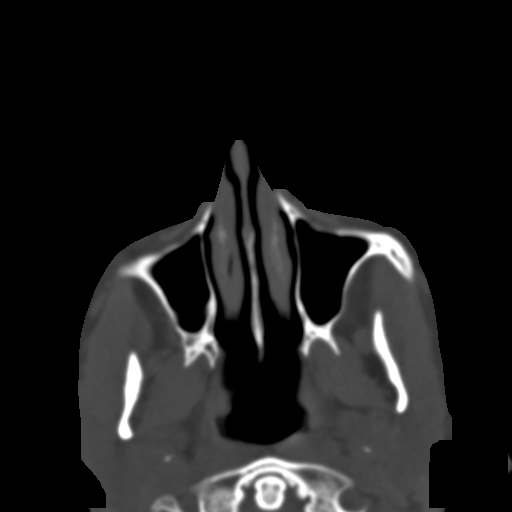
[im 51/83  bone]
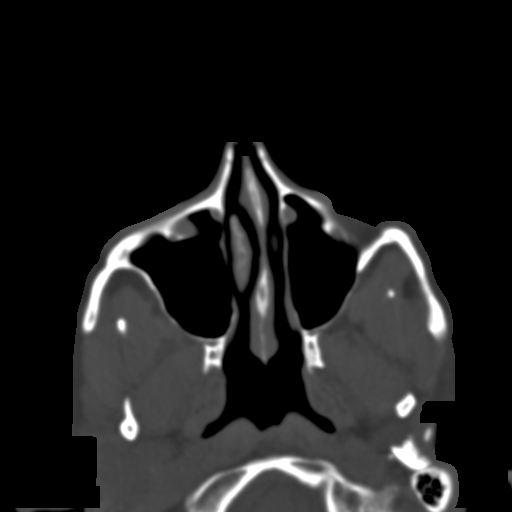
[im 60/83  bone]
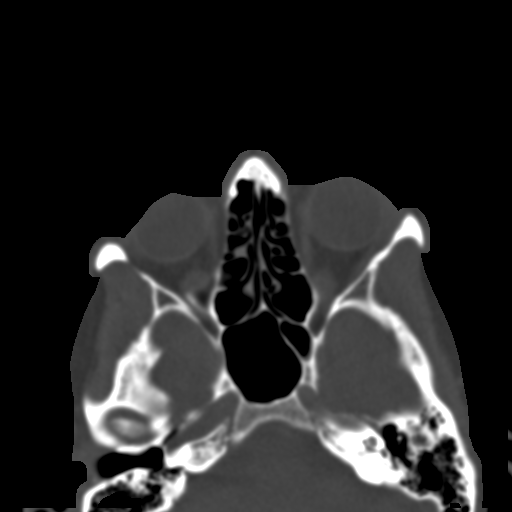
[im 68/83  bone]
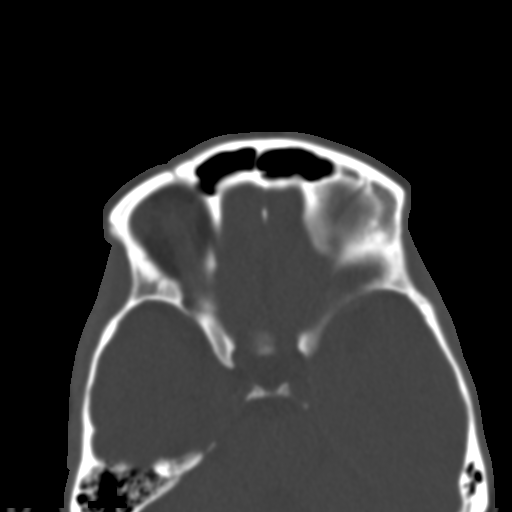
[im 77/83  brain]
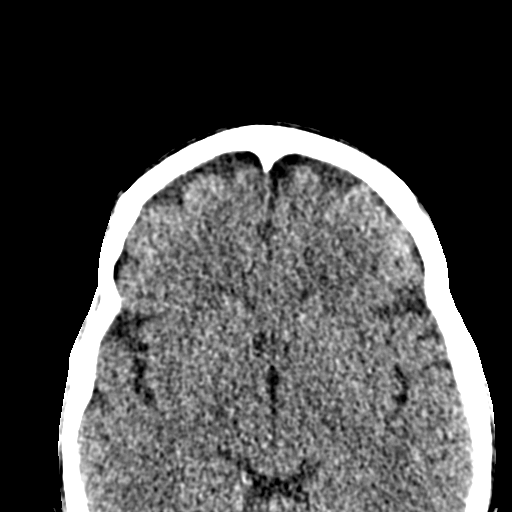
[im 77/83  bone]
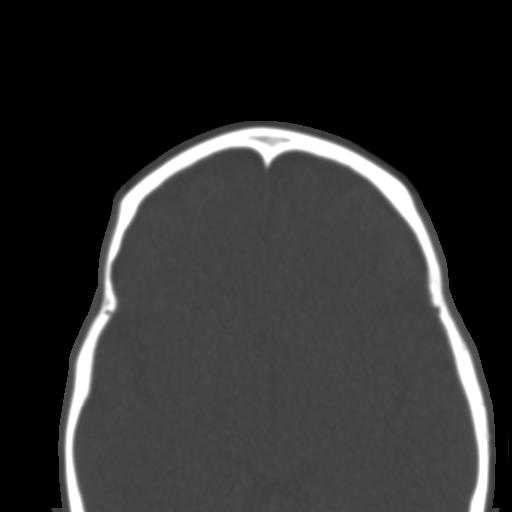

[Series 5: coronal st · coronal · 0.32mm/px · 3 of 77 slices shown]
[im 20/77  bone]
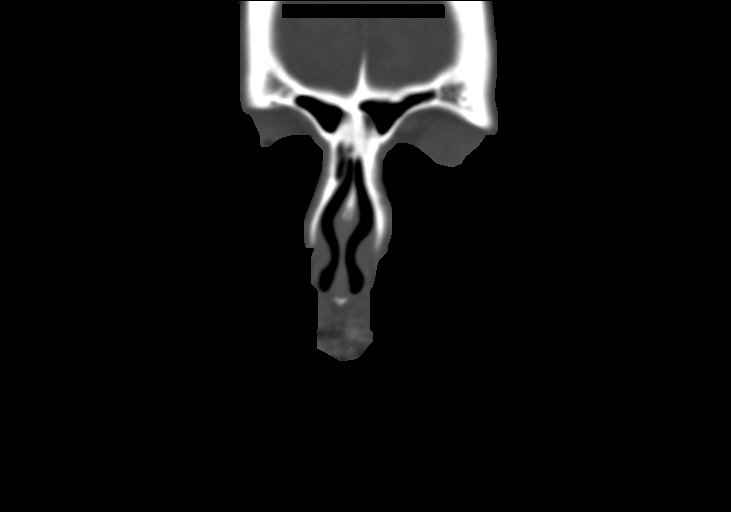
[im 39/77  bone]
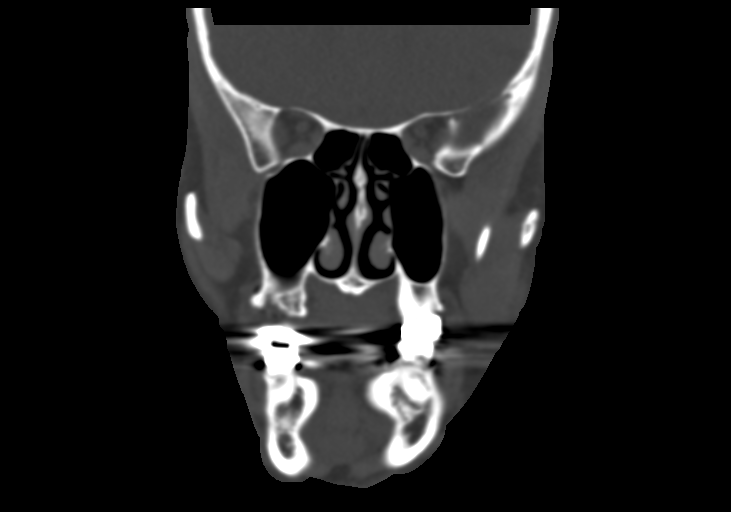
[im 58/77  bone]
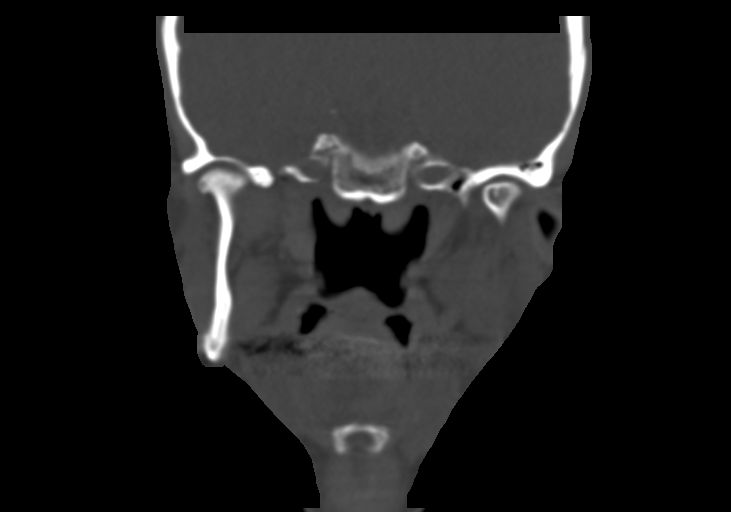

[Series 6: sagittal st · sagittal · 0.29mm/px · 3 of 74 slices shown]
[im 25/74  bone]
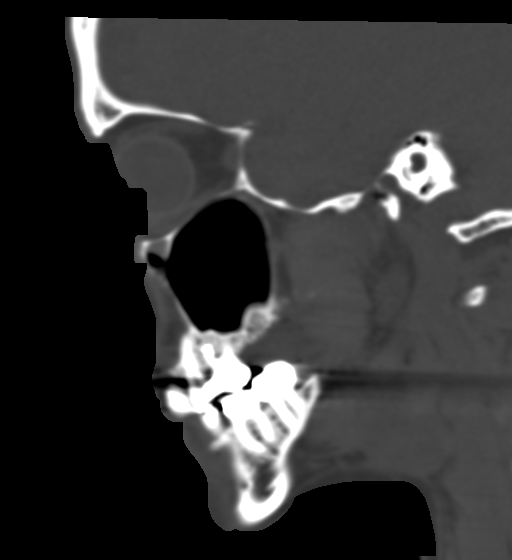
[im 37/74  bone]
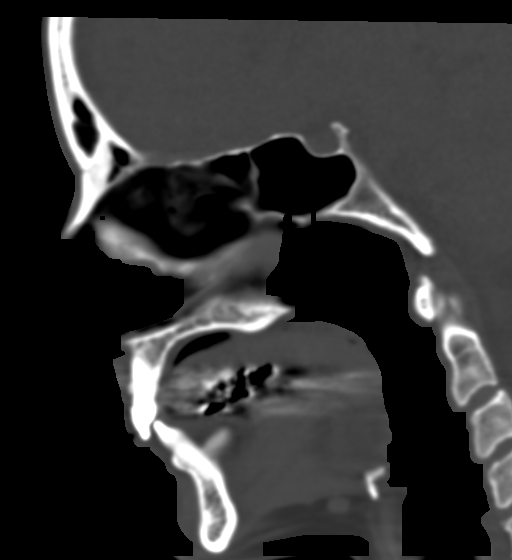
[im 49/74  bone]
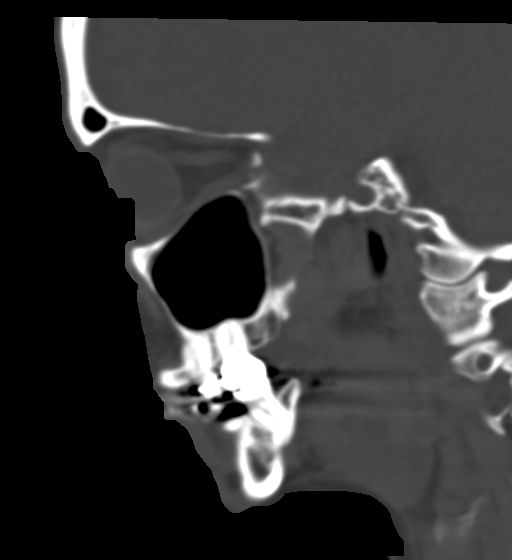

[15 of 47 positions shown; findings below may reference images not displayed]

FINDINGS: The globes and extraocular muscles appear intact and symmetrical.
Minimal mucosal thickening in the roof of the right maxillary
antrum. The paranasal sinuses are otherwise clear. The orbital rims,
maxillary antral walls, nasal bones, nasal septum, pterygoid plates,
and zygomatic arches are intact. Temporomandibular joints are
nondisplaced. There is evidence of degenerative flattening of the
mandibular head on the left. The mandibles appear intact. Evaluation
of the teeth is limited due to artifact from dental hardware. There
appear to been some previous tooth extractions. In the right
maxillary teeth, there is evidence of periapical lucency, possibly
indicating periodontal disease. Soft tissues are unremarkable.
IMPRESSION: No acute orbital or facial fractures are identified. Mandibles
appear intact. Suggestion of periodontal disease involving some of
the maxillary teeth. Degenerative changes in the left
temporomandibular joint.

## 2017-06-07 ENCOUNTER — Ambulatory Visit (INDEPENDENT_AMBULATORY_CARE_PROVIDER_SITE_OTHER): Payer: 59 | Admitting: Family Medicine

## 2017-06-07 ENCOUNTER — Other Ambulatory Visit: Payer: Self-pay

## 2017-06-07 ENCOUNTER — Encounter: Payer: Self-pay | Admitting: Family Medicine

## 2017-06-07 VITALS — BP 138/82 | HR 94 | Temp 98.2°F | Resp 16 | Ht 63.0 in | Wt 124.0 lb

## 2017-06-07 DIAGNOSIS — E7800 Pure hypercholesterolemia, unspecified: Secondary | ICD-10-CM

## 2017-06-07 DIAGNOSIS — Z131 Encounter for screening for diabetes mellitus: Secondary | ICD-10-CM | POA: Diagnosis not present

## 2017-06-07 DIAGNOSIS — Z0000 Encounter for general adult medical examination without abnormal findings: Secondary | ICD-10-CM | POA: Diagnosis not present

## 2017-06-07 DIAGNOSIS — Z136 Encounter for screening for cardiovascular disorders: Secondary | ICD-10-CM

## 2017-06-07 DIAGNOSIS — F17200 Nicotine dependence, unspecified, uncomplicated: Secondary | ICD-10-CM

## 2017-06-07 LAB — POCT URINALYSIS DIP (MANUAL ENTRY)
Bilirubin, UA: NEGATIVE
Glucose, UA: NEGATIVE mg/dL
LEUKOCYTES UA: NEGATIVE
Nitrite, UA: NEGATIVE
PROTEIN UA: NEGATIVE mg/dL
Spec Grav, UA: 1.025 (ref 1.010–1.025)
UROBILINOGEN UA: 0.2 U/dL
pH, UA: 6 (ref 5.0–8.0)

## 2017-06-07 NOTE — Progress Notes (Signed)
Subjective:    Patient ID: Sophia Sims, female    DOB: [birth year], 51 y.o.   MRN: [id number]  06/07/2017  Annual Exam    HPI This 51 y.o. female presents for Complete Physical Examination.  Last physical:  [phone] Pap smear:  WNL no HPV; irregular menses. Short; few days.   Mammogram:  2013; did not get.  Getting ready to move. Eye exam:  Glasses; no g/c/mc. Dental exam:  2018  Planning to move to [place].    BP Readings from Last 3 Encounters:  06/07/17 138/82  05/31/16 106/74  09/05/15 139/87   Wt Readings from Last 3 Encounters:  06/07/17 124 lb (56.2 kg)  05/31/16 119 lb 12.8 oz (54.3 kg)  06/11/15 120 lb 6.4 oz (54.6 kg)   Immunization History  Administered Date(s) Administered  . Tdap 05/31/2016    Review of Systems  Constitutional: Negative for activity change, appetite change, chills, diaphoresis, fatigue, fever and unexpected weight change.  HENT: Positive for postnasal drip and sinus pressure. Negative for congestion, dental problem, drooling, ear discharge, ear pain, facial swelling, hearing loss, mouth sores, nosebleeds, rhinorrhea, sneezing, sore throat, tinnitus, trouble swallowing and voice change.   Eyes: Negative for photophobia, pain, discharge, redness, itching and visual disturbance.  Respiratory: Negative for apnea, cough, choking, chest tightness, shortness of breath, wheezing and stridor.   Cardiovascular: Negative for chest pain, palpitations and leg swelling.  Gastrointestinal: Negative for abdominal distention, abdominal pain, anal bleeding, blood in stool, constipation, diarrhea, nausea, rectal pain and vomiting.  Endocrine: Negative for cold intolerance, heat intolerance, polydipsia, polyphagia and polyuria.  Genitourinary: Negative for decreased urine volume, difficulty urinating, dyspareunia, dysuria, enuresis, flank pain, frequency, genital sores, hematuria, menstrual problem, pelvic pain, urgency, vaginal bleeding, vaginal discharge  and vaginal pain.       Nocturia x 1.  No urinary leakage.  Musculoskeletal: Negative for arthralgias, back pain, gait problem, joint swelling, myalgias, neck pain and neck stiffness.  Skin: Negative for color change, pallor, rash and wound.  Allergic/Immunologic: Positive for environmental allergies. Negative for food allergies and immunocompromised state.  Neurological: Negative for dizziness, tremors, seizures, syncope, facial asymmetry, speech difficulty, weakness, light-headedness, numbness and headaches.  Hematological: Negative for adenopathy. Does not bruise/bleed easily.  Psychiatric/Behavioral: Negative for agitation, behavioral problems, confusion, decreased concentration, dysphoric mood, hallucinations, self-injury, sleep disturbance and suicidal ideas. The patient is not nervous/anxious and is not hyperactive.        Bedtime 1100; wakes up at 500.    Past Medical History:  Diagnosis Date  . Allergy   . Compression fracture of L2 ([place]) 4/14   fall during obstacle race  . Hyperlipidemia    borderline, goal LDL 130 or less  . Low HDL (under 40)   . Seasonal allergic rhinitis   . Tobacco use disorder    failed Wellbutrin, counseling, Chantix  . Vitamin D deficiency    History reviewed. No pertinent surgical history. Allergies  Allergen Reactions  . Lipitor [Atorvastatin] Other (See Comments)    fogginess  . Erythromycin Rash   No current outpatient medications on file prior to visit.   No current facility-administered medications on file prior to visit.    Social History   Socioeconomic History  . Marital status: Married    Spouse name: separated from [place]  . Number of children: 2  . Years of education: master's  . Highest education level: Not on file  Social Needs  . Financial resource strain: Not on file  . Food insecurity -  worry: Not on file  . Food insecurity - inability: Not on file  . Transportation needs - medical: Not on file  . Transportation needs -  non-medical: Not on file  Occupational History  . 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 Smoking status: Current Every Day Smoker    Packs/day: 1.50    Years: 25.00    Pack years: 37.50  . Smokeless tobacco: Never Used  . Tobacco comment: enjoys smoking  Substance and Sexual Activity  . Alcohol use: Yes    Alcohol/week: 1.2 oz    Types: 1 Cans of beer, 1 Shots of liquor per week    Comment: 1-2 drinks every other week  . Drug use: No  . Sexual activity: Not Currently    Partners: Male    Birth control/protection: Condom    Comment: and contraceptive film  Other Topics Concern  . 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 Stroke Mother 77  . Hypertension Mother   . Hyperlipidemia Mother   . Heart disease Father        pacemaker  . Hyperlipidemia Brother   . Depression Brother   . Mental illness Brother   . Mental illness Daughter   . Cancer Neg Hx   . Diabetes Neg Hx        Objective:    BP 138/82   Pulse 94   Temp 98.2 F (36.8 C) (Oral)   Resp 16   Ht 5\' 3"  (1.6 m)   Wt 124 lb (56.2 kg)   LMP 05/13/2017 (Approximate)   SpO2 95%   BMI 21.97 kg/m  Physical Exam  Constitutional: She is oriented to person, place, and time. She appears well-developed and well-nourished. No distress.  HENT:  Head: Normocephalic and atraumatic.  Right Ear: External ear normal.  Left Ear: External ear normal.  Nose: Nose normal.  Mouth/Throat: Oropharynx is clear and moist.  Eyes: Conjunctivae and EOM are normal. Pupils are equal, round,  and reactive to light.  Neck: Normal range of motion and full passive range of motion without pain. Neck supple. No JVD present. Carotid bruit is not present. No thyromegaly present.  Cardiovascular: Normal rate, regular rhythm and normal heart sounds. Exam reveals no gallop and no friction rub.  No murmur heard. Pulmonary/Chest: Effort normal and breath sounds normal. She has no wheezes. She has no rales. Right breast exhibits no inverted nipple, no mass, no nipple discharge, no skin change and no tenderness. Left breast exhibits no inverted nipple, no mass, no nipple discharge, no skin change and no tenderness. Breasts are symmetrical.  Abdominal: Soft. Bowel sounds are normal. She exhibits no distension and no mass. There is no tenderness. There is no rebound and no guarding.  Musculoskeletal:       Right shoulder: Normal.       Left shoulder: Normal.       Cervical back: Normal.  Lymphadenopathy:  She has no cervical adenopathy.  Neurological: She is alert and oriented to person, place, and time. She has normal reflexes. No cranial nerve deficit. She exhibits normal muscle tone. Coordination normal.  Skin: Skin is warm and dry. No rash noted. She is not diaphoretic. No erythema. No pallor.  Psychiatric: She has a normal mood and affect. Her behavior is normal. Judgment and thought content normal.  Nursing note and vitals reviewed.  No results found. Depression screen [facility] 2/9 06/07/2017 06/07/2017 05/31/2016 06/11/2015  Decreased Interest 0 0 0 0  Down, Depressed, Hopeless 0 0 0 0  PHQ - 2 Score 0 0 0 0   Fall Risk  06/07/2017 06/07/2017 05/31/2016  Falls in the past year? No No No        Assessment & Plan:   1. Routine physical examination   2. Screening for diabetes mellitus   3. Pure hypercholesterolemia   4. Screening for cardiovascular condition   5. Tobacco use disorder     -anticipatory guidance provided --- exercise, weight loss, safe driving practices, aspirin 81mg   daily, calcium 600mg  bid or 3 servings of dairy daily, safe sexual practices.. -obtain age appropriate screening labs and labs for chronic disease management. -Smoking cessation instruction/counseling given:  counseled patient on the dangers of tobacco use, advised patient to stop smoking, and reviewed strategies to maximize success.   Orders Placed This Encounter  Procedures  . CBC with Differential/Platelet  . Comprehensive metabolic panel    Order Specific Question:   Has the patient fasted?    Answer:   No  . Hemoglobin A1c  . Lipid panel    Order Specific Question:   Has the patient fasted?    Answer:   No  . TSH  . POCT urinalysis dipstick  . EKG 12-Lead   No orders of the defined types were placed in this encounter.   Return in about 1 year (around 06/07/2018) for complete physical examiniation.   Sophia Sims Elayne Guerin, M.D. Primary Care at [facility] previously [facility] [place] [address] [place], [place]  [address] [phone] phone [phone] fax

## 2017-06-07 NOTE — Patient Instructions (Addendum)
IF you received an x-ray today, you will receive an invoice from [facility]. Please contact [facility] at [phone] with questions or concerns regarding your invoice.   IF you received labwork today, you will receive an invoice from [facility]. Please contact [facility] at [phone] with questions or concerns regarding your invoice.   Our billing staff will not be able to assist you with questions regarding bills from these companies.  You will be contacted with the lab results as soon as they are available. The fastest way to get your results is to activate your My Chart account. Instructions are located on the last page of this paperwork. If you have not heard from [place] regarding the results in 2 weeks, please contact this office.      Preventive Care 40-64 Years, Female Preventive care refers to lifestyle choices and visits with your health care provider that can promote health and wellness. What does preventive care include?  A yearly physical exam. This is also called an annual well check.  Dental exams once or twice a year.  Routine eye exams. Ask your health care provider how often you should have your eyes checked.  Personal lifestyle choices, including: ? Daily care of your teeth and gums. ? Regular physical activity. ? Eating a healthy diet. ? Avoiding tobacco and drug use. ? Limiting alcohol use. ? Practicing safe sex. ? Taking low-dose aspirin daily starting at age 34. ? Taking vitamin and mineral supplements as recommended by your health care provider. What happens during an annual well check? The services and screenings done by your health care provider during your annual well check will depend on your age, overall health, lifestyle risk factors, and family history of disease. Counseling Your health care provider may ask you questions about your:  Alcohol use.  Tobacco use.  Drug use.  Emotional well-being.  Home and relationship  well-being.  Sexual activity.  Eating habits.  Work and work Statistician.  Method of birth control.  Menstrual cycle.  Pregnancy history.  Screening You may have the following tests or measurements:  Height, weight, and BMI.  Blood pressure.  Lipid and cholesterol levels. These may be checked every 5 years, or more frequently if you are over 65 years old.  Skin check.  Lung cancer screening. You may have this screening every year starting at age 102 if you have a 30-pack-year history of smoking and currently smoke or have quit within the past 15 years.  Fecal occult blood test (FOBT) of the stool. You may have this test every year starting at age 33.  Flexible sigmoidoscopy or colonoscopy. You may have a sigmoidoscopy every 5 years or a colonoscopy every 10 years starting at age 23.  Hepatitis C blood test.  Hepatitis B blood test.  Sexually transmitted disease (STD) testing.  Diabetes screening. This is done by checking your blood sugar (glucose) after you have not eaten for a while (fasting). You may have this done every 1-3 years.  Mammogram. This may be done every 1-2 years. Talk to your health care provider about when you should start having regular mammograms. This may depend on whether you have a family history of breast cancer.  BRCA-related cancer screening. This may be done if you have a family history of breast, ovarian, tubal, or peritoneal cancers.  Pelvic exam and Pap test. This may be done every 3 years starting at age 3. Starting at age 55, this may be done every 5 years if  you have a Pap test in combination with an HPV test.  Bone density scan. This is done to screen for osteoporosis. You may have this scan if you are at high risk for osteoporosis.  Discuss your test results, treatment options, and if necessary, the need for more tests with your health care provider. Vaccines Your health care provider may recommend certain vaccines, such  as:  Influenza vaccine. This is recommended every year.  Tetanus, diphtheria, and acellular pertussis (Tdap, Td) vaccine. You may need a Td booster every 10 years.  Varicella vaccine. You may need this if you have not been vaccinated.  Zoster vaccine. You may need this after age 77.  Measles, mumps, and rubella (MMR) vaccine. You may need at least one dose of MMR if you were born in 1957 or later. You may also need a second dose.  Pneumococcal 13-valent conjugate (PCV13) vaccine. You may need this if you have certain conditions and were not previously vaccinated.  Pneumococcal polysaccharide (PPSV23) vaccine. You may need one or two doses if you smoke cigarettes or if you have certain conditions.  Meningococcal vaccine. You may need this if you have certain conditions.  Hepatitis A vaccine. You may need this if you have certain conditions or if you travel or work in places where you may be exposed to hepatitis A.  Hepatitis B vaccine. You may need this if you have certain conditions or if you travel or work in places where you may be exposed to hepatitis B.  Haemophilus influenzae type b (Hib) vaccine. You may need this if you have certain conditions.  Talk to your health care provider about which screenings and vaccines you need and how often you need them. This information is not intended to replace advice given to you by your health care provider. Make sure you discuss any questions you have with your health care provider. Document Released: 06/26/2015 Document Revised: 02/17/2016 Document Reviewed: 03/31/2015 Elsevier Interactive Patient Education  [employer].

## 2017-06-08 LAB — CBC WITH DIFFERENTIAL/PLATELET
BASOS ABS: 0 10*3/uL (ref 0.0–0.2)
Basos: 1 %
EOS (ABSOLUTE): 0.1 10*3/uL (ref 0.0–0.4)
Eos: 2 %
Hematocrit: 40.4 % (ref 34.0–46.6)
Hemoglobin: 13.7 g/dL (ref 11.1–15.9)
IMMATURE GRANS (ABS): 0 10*3/uL (ref 0.0–0.1)
IMMATURE GRANULOCYTES: 0 %
LYMPHS: 34 %
Lymphocytes Absolute: 2.3 10*3/uL (ref 0.7–3.1)
MCH: 30.9 pg (ref 26.6–33.0)
MCHC: 33.9 g/dL (ref 31.5–35.7)
MCV: 91 fL (ref 79–97)
Monocytes Absolute: 0.6 10*3/uL (ref 0.1–0.9)
Monocytes: 9 %
NEUTROS PCT: 54 %
Neutrophils Absolute: 3.7 10*3/uL (ref 1.4–7.0)
PLATELETS: 230 10*3/uL (ref 150–379)
RBC: 4.44 x10E6/uL (ref 3.77–5.28)
RDW: 16.1 % — AB (ref 12.3–15.4)
WBC: 6.8 10*3/uL (ref 3.4–10.8)

## 2017-06-08 LAB — COMPREHENSIVE METABOLIC PANEL
A/G RATIO: 1.6 (ref 1.2–2.2)
ALT: 12 IU/L (ref 0–32)
AST: 14 IU/L (ref 0–40)
Albumin: 3.9 g/dL (ref 3.5–5.5)
Alkaline Phosphatase: 64 IU/L (ref 39–117)
BUN/Creatinine Ratio: 19 (ref 9–23)
BUN: 10 mg/dL (ref 6–24)
Bilirubin Total: <0.2 mg/dL (ref 0.0–1.2)
CO2: 22 mmol/L (ref 20–29)
Calcium: 9.1 mg/dL (ref 8.7–10.2)
Chloride: 102 mmol/L (ref 96–106)
Creatinine, Ser: 0.52 mg/dL — ABNORMAL LOW (ref 0.57–1.00)
GFR calc Af Amer: 130 mL/min/{1.73_m2} (ref >59)
GFR calc non Af Amer: 113 mL/min/{1.73_m2} (ref >59)
GLUCOSE: 101 mg/dL — AB (ref 65–99)
Globulin, Total: 2.5 g/dL (ref 1.5–4.5)
POTASSIUM: 4.1 mmol/L (ref 3.5–5.2)
Sodium: 138 mmol/L (ref 134–144)
Total Protein: 6.4 g/dL (ref 6.0–8.5)

## 2017-06-08 LAB — LIPID PANEL
CHOLESTEROL TOTAL: 204 mg/dL — AB (ref 100–199)
Chol/HDL Ratio: 4 ratio (ref 0.0–4.4)
HDL: 51 mg/dL (ref >39)
LDL Calculated: 126 mg/dL — ABNORMAL HIGH (ref 0–99)
TRIGLYCERIDES: 137 mg/dL (ref 0–149)
VLDL CHOLESTEROL CAL: 27 mg/dL (ref 5–40)

## 2017-06-08 LAB — HEMOGLOBIN A1C
ESTIMATED AVERAGE GLUCOSE: 123 mg/dL
Hgb A1c MFr Bld: 5.9 % — ABNORMAL HIGH (ref 4.8–5.6)

## 2017-06-08 LAB — TSH: TSH: 1.48 u[IU]/mL (ref 0.450–4.500)

## 2017-06-26 ENCOUNTER — Encounter: Payer: Self-pay | Admitting: Family Medicine

## 2017-06-27 ENCOUNTER — Encounter: Payer: Self-pay | Admitting: Family Medicine

## 2017-11-02 ENCOUNTER — Encounter: Payer: Self-pay | Admitting: Family Medicine

## 2019-03-27 ENCOUNTER — Other Ambulatory Visit: Payer: Self-pay

## 2019-03-27 DIAGNOSIS — Z20822 Contact with and (suspected) exposure to covid-19: Secondary | ICD-10-CM

## 2019-03-28 LAB — NOVEL CORONAVIRUS, NAA: SARS-CoV-2, NAA: NOT DETECTED

## 2019-05-20 ENCOUNTER — Other Ambulatory Visit: Payer: Self-pay

## 2019-05-20 DIAGNOSIS — Z20822 Contact with and (suspected) exposure to covid-19: Secondary | ICD-10-CM

## 2019-05-21 LAB — NOVEL CORONAVIRUS, NAA: SARS-CoV-2, NAA: NOT DETECTED

## 2019-08-08 ENCOUNTER — Ambulatory Visit: Payer: Self-pay | Attending: Internal Medicine

## 2019-08-08 DIAGNOSIS — Z23 Encounter for immunization: Secondary | ICD-10-CM | POA: Insufficient documentation

## 2019-08-08 NOTE — Progress Notes (Signed)
   Covid-19 Vaccination Clinic  Name:  Sophia Sims    MRN: [id number] DOB: [birth year]  08/08/2019  Sophia Sims was observed post Covid-19 immunization for 15 minutes without incidence. She was provided with Vaccine Information Sheet and instruction to access the V-Safe system.   Sophia Sims was instructed to call 911 with any severe reactions post vaccine: Marland Kitchen Difficulty breathing  . Swelling of your face and throat  . A fast heartbeat  . A bad rash all over your body  . Dizziness and weakness    Immunizations Administered    Name Date Dose VIS Date Route   Pfizer COVID-19 Vaccine 08/08/2019  9:48 AM 0.3 mL 05/24/2019 Intramuscular   Manufacturer: [place]   Lot: [id number]   Axis: [id number]

## 2019-08-28 ENCOUNTER — Ambulatory Visit: Payer: Self-pay | Attending: Internal Medicine

## 2019-08-28 DIAGNOSIS — Z23 Encounter for immunization: Secondary | ICD-10-CM

## 2019-08-28 NOTE — Progress Notes (Signed)
   Covid-19 Vaccination Clinic  Name:  Sophia Sims    MRN: [id number] DOB: Nov 06, 1965  08/28/2019  [name] was observed post Covid-19 immunization for 15 minutes without incident. She was provided with Vaccine Information Sheet and instruction to access the V-Safe system.   Ms. Peri was instructed to call 911 with any severe reactions post vaccine: Marland Kitchen Difficulty breathing  . Swelling of face and throat  . A fast heartbeat  . A bad rash all over body  . Dizziness and weakness   Immunizations Administered    Name Date Dose VIS Date Route   Pfizer COVID-19 Vaccine 08/28/2019  2:14 PM 0.3 mL 05/24/2019 Intramuscular   Manufacturer: [place]   Lot: [id number]   [place]: [id number]

## 2020-03-28 ENCOUNTER — Ambulatory Visit: Payer: Self-pay
